# Patient Record
Sex: Male | Born: 1935
Health system: Southern US, Community
[De-identification: ages and names within clinical notes are randomized; demographics above are authoritative.]

## PROBLEM LIST (undated history)

## (undated) DIAGNOSIS — N3281 Overactive bladder: Secondary | ICD-10-CM

## (undated) DIAGNOSIS — J449 Chronic obstructive pulmonary disease, unspecified: Secondary | ICD-10-CM

## (undated) DIAGNOSIS — E039 Hypothyroidism, unspecified: Secondary | ICD-10-CM

## (undated) DIAGNOSIS — E2740 Unspecified adrenocortical insufficiency: Secondary | ICD-10-CM

## (undated) DIAGNOSIS — Z7901 Long term (current) use of anticoagulants: Secondary | ICD-10-CM

## (undated) DIAGNOSIS — D649 Anemia, unspecified: Secondary | ICD-10-CM

## (undated) DIAGNOSIS — N400 Enlarged prostate without lower urinary tract symptoms: Secondary | ICD-10-CM

## (undated) DIAGNOSIS — I2510 Atherosclerotic heart disease of native coronary artery without angina pectoris: Secondary | ICD-10-CM

## (undated) DIAGNOSIS — E559 Vitamin D deficiency, unspecified: Secondary | ICD-10-CM

## (undated) DIAGNOSIS — I480 Paroxysmal atrial fibrillation: Secondary | ICD-10-CM

## (undated) DIAGNOSIS — M1990 Unspecified osteoarthritis, unspecified site: Secondary | ICD-10-CM

## (undated) HISTORY — PX: PACEMAKER INSERTION: SHX728

---

## 2018-10-03 ENCOUNTER — Other Ambulatory Visit: Payer: Self-pay | Admitting: Internal Medicine

## 2018-10-03 DIAGNOSIS — M549 Dorsalgia, unspecified: Secondary | ICD-10-CM

## 2018-10-07 ENCOUNTER — Other Ambulatory Visit: Payer: Self-pay

## 2018-10-07 ENCOUNTER — Encounter (HOSPITAL_COMMUNITY): Payer: Self-pay

## 2018-10-07 ENCOUNTER — Emergency Department (HOSPITAL_COMMUNITY): Payer: 59

## 2018-10-07 ENCOUNTER — Inpatient Hospital Stay (HOSPITAL_COMMUNITY)
Admission: EM | Admit: 2018-10-07 | Discharge: 2018-10-11 | DRG: 308 | Disposition: A | Discharge status: Skilled Nursing Facility | Payer: 59 | Attending: Internal Medicine | Admitting: Internal Medicine

## 2018-10-07 DIAGNOSIS — N400 Enlarged prostate without lower urinary tract symptoms: Secondary | ICD-10-CM | POA: Diagnosis present

## 2018-10-07 DIAGNOSIS — N3281 Overactive bladder: Secondary | ICD-10-CM | POA: Diagnosis present

## 2018-10-07 DIAGNOSIS — E559 Vitamin D deficiency, unspecified: Secondary | ICD-10-CM | POA: Diagnosis present

## 2018-10-07 DIAGNOSIS — Z7901 Long term (current) use of anticoagulants: Secondary | ICD-10-CM

## 2018-10-07 DIAGNOSIS — I248 Other forms of acute ischemic heart disease: Secondary | ICD-10-CM | POA: Diagnosis present

## 2018-10-07 DIAGNOSIS — I480 Paroxysmal atrial fibrillation: Principal | ICD-10-CM | POA: Diagnosis present

## 2018-10-07 DIAGNOSIS — R52 Pain, unspecified: Secondary | ICD-10-CM

## 2018-10-07 DIAGNOSIS — L89151 Pressure ulcer of sacral region, stage 1: Secondary | ICD-10-CM | POA: Diagnosis present

## 2018-10-07 DIAGNOSIS — I2510 Atherosclerotic heart disease of native coronary artery without angina pectoris: Secondary | ICD-10-CM | POA: Diagnosis not present

## 2018-10-07 DIAGNOSIS — F419 Anxiety disorder, unspecified: Secondary | ICD-10-CM | POA: Diagnosis present

## 2018-10-07 DIAGNOSIS — J209 Acute bronchitis, unspecified: Secondary | ICD-10-CM | POA: Diagnosis present

## 2018-10-07 DIAGNOSIS — G8929 Other chronic pain: Secondary | ICD-10-CM | POA: Diagnosis present

## 2018-10-07 DIAGNOSIS — Z7951 Long term (current) use of inhaled steroids: Secondary | ICD-10-CM

## 2018-10-07 DIAGNOSIS — M545 Low back pain, unspecified: Secondary | ICD-10-CM

## 2018-10-07 DIAGNOSIS — R7989 Other specified abnormal findings of blood chemistry: Secondary | ICD-10-CM | POA: Diagnosis not present

## 2018-10-07 DIAGNOSIS — I4891 Unspecified atrial fibrillation: Secondary | ICD-10-CM | POA: Diagnosis not present

## 2018-10-07 DIAGNOSIS — E039 Hypothyroidism, unspecified: Secondary | ICD-10-CM | POA: Diagnosis present

## 2018-10-07 DIAGNOSIS — R778 Other specified abnormalities of plasma proteins: Secondary | ICD-10-CM | POA: Diagnosis present

## 2018-10-07 DIAGNOSIS — L8990 Pressure ulcer of unspecified site, unspecified stage: Secondary | ICD-10-CM

## 2018-10-07 DIAGNOSIS — E43 Unspecified severe protein-calorie malnutrition: Secondary | ICD-10-CM | POA: Diagnosis present

## 2018-10-07 DIAGNOSIS — Z7952 Long term (current) use of systemic steroids: Secondary | ICD-10-CM

## 2018-10-07 DIAGNOSIS — R64 Cachexia: Secondary | ICD-10-CM | POA: Diagnosis present

## 2018-10-07 DIAGNOSIS — Z950 Presence of cardiac pacemaker: Secondary | ICD-10-CM

## 2018-10-07 DIAGNOSIS — I951 Orthostatic hypotension: Secondary | ICD-10-CM | POA: Diagnosis present

## 2018-10-07 DIAGNOSIS — Z681 Body mass index (BMI) 19 or less, adult: Secondary | ICD-10-CM

## 2018-10-07 DIAGNOSIS — M549 Dorsalgia, unspecified: Secondary | ICD-10-CM | POA: Diagnosis present

## 2018-10-07 DIAGNOSIS — E785 Hyperlipidemia, unspecified: Secondary | ICD-10-CM | POA: Diagnosis present

## 2018-10-07 DIAGNOSIS — I5032 Chronic diastolic (congestive) heart failure: Secondary | ICD-10-CM | POA: Diagnosis present

## 2018-10-07 DIAGNOSIS — J9611 Chronic respiratory failure with hypoxia: Secondary | ICD-10-CM | POA: Diagnosis present

## 2018-10-07 DIAGNOSIS — M1990 Unspecified osteoarthritis, unspecified site: Secondary | ICD-10-CM | POA: Diagnosis present

## 2018-10-07 DIAGNOSIS — J440 Chronic obstructive pulmonary disease with acute lower respiratory infection: Secondary | ICD-10-CM | POA: Diagnosis present

## 2018-10-07 DIAGNOSIS — E2740 Unspecified adrenocortical insufficiency: Secondary | ICD-10-CM | POA: Diagnosis present

## 2018-10-07 DIAGNOSIS — Z79890 Hormone replacement therapy (postmenopausal): Secondary | ICD-10-CM

## 2018-10-07 DIAGNOSIS — J449 Chronic obstructive pulmonary disease, unspecified: Secondary | ICD-10-CM

## 2018-10-07 HISTORY — DX: Chronic obstructive pulmonary disease, unspecified: J44.9

## 2018-10-07 HISTORY — DX: Atherosclerotic heart disease of native coronary artery without angina pectoris: I25.10

## 2018-10-07 HISTORY — DX: Long term (current) use of anticoagulants: Z79.01

## 2018-10-07 HISTORY — DX: Benign prostatic hyperplasia without lower urinary tract symptoms: N40.0

## 2018-10-07 HISTORY — DX: Overactive bladder: N32.81

## 2018-10-07 HISTORY — DX: Unspecified osteoarthritis, unspecified site: M19.90

## 2018-10-07 HISTORY — DX: Anemia, unspecified: D64.9

## 2018-10-07 HISTORY — DX: Vitamin D deficiency, unspecified: E55.9

## 2018-10-07 HISTORY — DX: Paroxysmal atrial fibrillation: I48.0

## 2018-10-07 HISTORY — DX: Hypothyroidism, unspecified: E03.9

## 2018-10-07 HISTORY — DX: Unspecified adrenocortical insufficiency: E27.40

## 2018-10-07 LAB — COMPREHENSIVE METABOLIC PANEL
ALK PHOS: 91 U/L (ref 38–126)
ALT: 14 U/L (ref 0–44)
AST: 20 U/L (ref 15–41)
Albumin: 2.8 g/dL — ABNORMAL LOW (ref 3.5–5.0)
Anion gap: 13 (ref 5–15)
BUN: 31 mg/dL — ABNORMAL HIGH (ref 8–23)
CALCIUM: 8.6 mg/dL — AB (ref 8.9–10.3)
CO2: 21 mmol/L — ABNORMAL LOW (ref 22–32)
Chloride: 101 mmol/L (ref 98–111)
Creatinine, Ser: 0.77 mg/dL (ref 0.61–1.24)
GFR calc Af Amer: >60 mL/min (ref >60)
GFR calc non Af Amer: >60 mL/min (ref >60)
Glucose, Bld: 110 mg/dL — ABNORMAL HIGH (ref 70–99)
Potassium: 4.1 mmol/L (ref 3.5–5.1)
Sodium: 135 mmol/L (ref 135–145)
Total Bilirubin: 1.3 mg/dL — ABNORMAL HIGH (ref 0.3–1.2)
Total Protein: 5.2 g/dL — ABNORMAL LOW (ref 6.5–8.1)

## 2018-10-07 LAB — CBC WITH DIFFERENTIAL/PLATELET
Abs Immature Granulocytes: 0.17 10*3/uL — ABNORMAL HIGH (ref 0.00–0.07)
Basophils Absolute: 0 10*3/uL (ref 0.0–0.1)
Basophils Relative: 0 %
EOS ABS: 0 10*3/uL (ref 0.0–0.5)
Eosinophils Relative: 0 %
HEMATOCRIT: 36.5 % — AB (ref 39.0–52.0)
Hemoglobin: 12 g/dL — ABNORMAL LOW (ref 13.0–17.0)
Immature Granulocytes: 2 %
LYMPHS ABS: 0.5 10*3/uL — AB (ref 0.7–4.0)
Lymphocytes Relative: 4 %
MCH: 32.3 pg (ref 26.0–34.0)
MCHC: 32.9 g/dL (ref 30.0–36.0)
MCV: 98.1 fL (ref 80.0–100.0)
Monocytes Absolute: 0.7 10*3/uL (ref 0.1–1.0)
Monocytes Relative: 6 %
Neutro Abs: 10.1 10*3/uL — ABNORMAL HIGH (ref 1.7–7.7)
Neutrophils Relative %: 88 %
Platelets: 183 10*3/uL (ref 150–400)
RBC: 3.72 MIL/uL — ABNORMAL LOW (ref 4.22–5.81)
RDW: 13.9 % (ref 11.5–15.5)
WBC: 11.5 10*3/uL — ABNORMAL HIGH (ref 4.0–10.5)
nRBC: 0 % (ref 0.0–0.2)

## 2018-10-07 LAB — PROTIME-INR
INR: 3 — ABNORMAL HIGH (ref 0.8–1.2)
Prothrombin Time: 30.7 seconds — ABNORMAL HIGH (ref 11.4–15.2)

## 2018-10-07 LAB — TROPONIN I: Troponin I: 0.03 ng/mL (ref <0.03)

## 2018-10-07 LAB — BRAIN NATRIURETIC PEPTIDE: B Natriuretic Peptide: 356 pg/mL — ABNORMAL HIGH (ref 0.0–100.0)

## 2018-10-07 LAB — MAGNESIUM: Magnesium: 1.9 mg/dL (ref 1.7–2.4)

## 2018-10-07 LAB — TSH: TSH: 0.869 u[IU]/mL (ref 0.350–4.500)

## 2018-10-07 MED ORDER — LIDOCAINE 4 % EX PTCH: 1.0000 | MEDICATED_PATCH | Freq: Every day | CUTANEOUS | Status: DC

## 2018-10-07 MED ORDER — DILTIAZEM HCL 25 MG/5ML IV SOLN
15.0000 mg | Freq: Once | INTRAVENOUS | Status: AC
  Administered 2018-10-07: 15 mg via INTRAVENOUS
  Filled 2018-10-07: qty 5

## 2018-10-07 MED ORDER — WARFARIN 0.5 MG HALF TABLET
0.5000 mg | ORAL_TABLET | Freq: Once | ORAL | Status: AC
  Administered 2018-10-07: 0.5 mg via ORAL
  Filled 2018-10-07 (×2): qty 1

## 2018-10-07 MED ORDER — HYDROCODONE-ACETAMINOPHEN 5-325 MG PO TABS: 1.0000 | ORAL_TABLET | Freq: Four times a day (QID) | ORAL | Status: DC | PRN

## 2018-10-07 MED ORDER — BUDESONIDE 0.5 MG/2ML IN SUSP
0.5000 mg | Freq: Every day | RESPIRATORY_TRACT | Status: DC
  Administered 2018-10-08 (×2): 0.5 mg via RESPIRATORY_TRACT
  Filled 2018-10-07 (×2): qty 2

## 2018-10-07 MED ORDER — FLUDROCORTISONE ACETATE 0.1 MG PO TABS
0.1000 mg | ORAL_TABLET | Freq: Every day | ORAL | Status: DC
  Administered 2018-10-08 – 2018-10-11 (×4): 0.1 mg via ORAL
  Filled 2018-10-07 (×4): qty 1

## 2018-10-07 MED ORDER — ALPRAZOLAM 0.25 MG PO TABS
0.2500 mg | ORAL_TABLET | Freq: Every day | ORAL | Status: DC
  Administered 2018-10-07 – 2018-10-10 (×4): 0.25 mg via ORAL
  Filled 2018-10-07 (×4): qty 1

## 2018-10-07 MED ORDER — ALPRAZOLAM 0.25 MG PO TABS: 0.2500 mg | ORAL_TABLET | ORAL | Status: DC | PRN

## 2018-10-07 MED ORDER — ACETAMINOPHEN 325 MG PO TABS: 650.0000 mg | ORAL_TABLET | ORAL | Status: DC | PRN

## 2018-10-07 MED ORDER — LIDOCAINE 5 % EX PTCH
1.0000 | MEDICATED_PATCH | CUTANEOUS | Status: DC
  Administered 2018-10-08 – 2018-10-11 (×4): 1 via TRANSDERMAL
  Filled 2018-10-07 (×4): qty 1

## 2018-10-07 MED ORDER — IPRATROPIUM-ALBUTEROL 0.5-2.5 (3) MG/3ML IN SOLN: 3.0000 mL | Freq: Four times a day (QID) | RESPIRATORY_TRACT | Status: DC | PRN

## 2018-10-07 MED ORDER — IPRATROPIUM-ALBUTEROL 0.5-2.5 (3) MG/3ML IN SOLN
3.0000 mL | Freq: Two times a day (BID) | RESPIRATORY_TRACT | Status: DC
  Administered 2018-10-07: 3 mL via RESPIRATORY_TRACT
  Filled 2018-10-07 (×2): qty 3

## 2018-10-07 MED ORDER — WARFARIN - PHARMACIST DOSING INPATIENT
Freq: Every day | Status: DC
  Administered 2018-10-10

## 2018-10-07 MED ORDER — HYDROCODONE-ACETAMINOPHEN 5-325 MG PO TABS
1.0000 | ORAL_TABLET | Freq: Once | ORAL | Status: AC
  Administered 2018-10-07: 1 via ORAL
  Filled 2018-10-07: qty 1

## 2018-10-07 MED ORDER — IPRATROPIUM-ALBUTEROL 0.5-2.5 (3) MG/3ML IN SOLN
3.0000 mL | Freq: Two times a day (BID) | RESPIRATORY_TRACT | Status: DC
  Administered 2018-10-08: 3 mL via RESPIRATORY_TRACT
  Filled 2018-10-07: qty 3

## 2018-10-07 MED ORDER — ONDANSETRON HCL 4 MG/2ML IJ SOLN: 4.0000 mg | Freq: Four times a day (QID) | INTRAMUSCULAR | Status: DC | PRN

## 2018-10-07 MED ORDER — DILTIAZEM HCL-DEXTROSE 100-5 MG/100ML-% IV SOLN (PREMIX)
5.0000 mg/h | INTRAVENOUS | Status: DC
  Administered 2018-10-07: 5 mg/h via INTRAVENOUS
  Administered 2018-10-08 – 2018-10-10 (×3): 10 mg/h via INTRAVENOUS
  Filled 2018-10-07 (×6): qty 100

## 2018-10-07 MED ORDER — DILTIAZEM HCL 25 MG/5ML IV SOLN
10.0000 mg | Freq: Once | INTRAVENOUS | Status: AC
  Administered 2018-10-07: 10 mg via INTRAVENOUS
  Filled 2018-10-07: qty 5

## 2018-10-07 MED ORDER — FUROSEMIDE 10 MG/ML IJ SOLN
40.0000 mg | INTRAMUSCULAR | Status: AC
  Administered 2018-10-07: 40 mg via INTRAVENOUS
  Filled 2018-10-07: qty 4

## 2018-10-07 MED ORDER — ESCITALOPRAM OXALATE 10 MG PO TABS
10.0000 mg | ORAL_TABLET | Freq: Every day | ORAL | Status: DC
  Administered 2018-10-08 – 2018-10-11 (×4): 10 mg via ORAL
  Filled 2018-10-07 (×4): qty 1

## 2018-10-07 MED ORDER — BOOST / RESOURCE BREEZE PO LIQD CUSTOM
1.0000 | Freq: Two times a day (BID) | ORAL | Status: DC
  Administered 2018-10-09 – 2018-10-10 (×3): 1 via ORAL

## 2018-10-07 MED ORDER — PRAVASTATIN SODIUM 40 MG PO TABS
40.0000 mg | ORAL_TABLET | Freq: Every day | ORAL | Status: DC
  Administered 2018-10-08 – 2018-10-10 (×3): 40 mg via ORAL
  Filled 2018-10-07 (×3): qty 1

## 2018-10-07 MED ORDER — LEVOTHYROXINE SODIUM 88 MCG PO TABS
88.0000 ug | ORAL_TABLET | Freq: Every day | ORAL | Status: DC
  Administered 2018-10-08: 88 ug via ORAL
  Filled 2018-10-07: qty 1

## 2018-10-07 MED ORDER — PREDNISOLONE 5 MG PO TABS
5.0000 mg | ORAL_TABLET | Freq: Two times a day (BID) | ORAL | Status: DC
  Administered 2018-10-07: 5 mg via ORAL
  Filled 2018-10-07 (×3): qty 1

## 2018-10-07 MED ORDER — OXYBUTYNIN CHLORIDE 5 MG PO TABS
5.0000 mg | ORAL_TABLET | Freq: Every day | ORAL | Status: DC
  Administered 2018-10-07 – 2018-10-10 (×4): 5 mg via ORAL
  Filled 2018-10-07 (×4): qty 1

## 2018-10-07 NOTE — ED Triage Notes (Signed)
Pt brought in by [facility] from [facility] for chronic back pain that worsened over the last few days. EMS placed pt on 12-lead and noted pt HR to be 100-160 ion afib w/ rvr. Pt BP normotensive per EMS, pt denies any other complaints at this time.

## 2018-10-07 NOTE — ED Provider Notes (Signed)
[facility] EMERGENCY DEPARTMENT Provider Note   CSN: [id number] Arrival date & time: 10/07/18  1458    History   Chief Complaint Chief Complaint  Patient presents with  . Atrial Fibrillation  . Back Pain    HPI Vincent Mcneil is a 85 y.o. male.     85yo M w/ PMH including A fib on coumadin, COPD, CAD, pacemaker, adrenal insufficiency, hypothyroidism who p/w back pain and chest pain. Pt is a poor historian. Pt has h/o chronic low back pain that he states has been going on for months to years. He states his pain has been worse recently which he attributes to losing so much weight that there aren't enough pillows in his bed at his nursing facility to provide padding. Pain is in lower back and gets worse with certain movements. He denies any leg numbness but notes his ambulation is difficult 2/2 pain. He denies LE numbness or problems with urination. He also notes SOB that is chronic. He reports chest pain that he has had for "a while." He endorses some heart racing sensation that has been worse for the past 4 days.  The history is provided by the patient and the EMS personnel.  Atrial Fibrillation   Back Pain    Past Medical History:  Diagnosis Date  . Adrenal insufficiency (HCC)   . Anemia   . BPH without obstruction/lower urinary tract symptoms   . Chronic anticoagulation   . COPD (chronic obstructive pulmonary disease) (HCC)   . Coronary artery disease   . Hypothyroidism   . Osteoarthritis   . Overactive bladder   . Paroxysmal A-fib (HCC)   . Vitamin D deficiency     Patient Active Problem List   Diagnosis Date Noted  . Paroxysmal atrial fibrillation with RVR (HCC) 10/07/2018  . Hypothyroidism 10/07/2018  . Coronary artery disease 10/07/2018  . Steroid-dependent COPD (HCC) 10/07/2018  . Elevated troponin 10/07/2018  . Orthostatic hypotension 10/07/2018  . Chronic back pain 10/07/2018    Past Surgical History:  Procedure Laterality Date  .  PACEMAKER INSERTION          Home Medications    Prior to Admission medications   Medication Sig Start Date End Date Taking? Authorizing Provider  albuterol (PROVENTIL HFA;VENTOLIN HFA) 108 (90 Base) MCG/ACT inhaler Inhale 2 puffs into the lungs every 6 (six) hours as needed for wheezing or shortness of breath.   Yes [provider]  ALPRAZolam (XANAX) 0.25 MG tablet Take 0.25 mg by mouth at bedtime.   Yes [provider]  ALPRAZolam (XANAX) 0.25 MG tablet Take 0.25 mg by mouth as needed for anxiety. Started 09/29/18 for 14 days   Yes [provider]  budesonide (PULMICORT) 0.5 MG/2ML nebulizer solution Take 0.5 mg by nebulization daily.   Yes [provider]  Cholecalciferol (VITAMIN D3) 1.25 MG (50000 UT) CAPS Take 50,000 Units by mouth once a week. Thursday   Yes [provider]  cholecalciferol (VITAMIN D3) 10 MCG (400 UNIT) TABS tablet Take 400 Units by mouth every other day.   Yes [provider]  escitalopram (LEXAPRO) 10 MG tablet Take 10 mg by mouth daily.   Yes [provider]  feeding supplement (BOOST HIGH PROTEIN) LIQD Take 1 Container by mouth 2 (two) times daily.   Yes [provider]  ferrous sulfate 325 (65 FE) MG tablet Take 325 mg by mouth daily with breakfast.   Yes [provider]  fludrocortisone (FLORINEF) 0.1 MG tablet  Take 0.1 mg by mouth daily.   Yes [provider]  HYDROcodone-acetaminophen (NORCO/VICODIN) 5-325 MG tablet Take 1 tablet by mouth every 6 (six) hours as needed for moderate pain.   Yes [provider]  ipratropium-albuterol (DUONEB) 0.5-2.5 (3) MG/3ML SOLN Take 3 mLs by nebulization 2 (two) times daily.   Yes [provider]  ipratropium-albuterol (DUONEB) 0.5-2.5 (3) MG/3ML SOLN Take 3 mLs by nebulization every 6 (six) hours as needed.   Yes [provider]  Boris Lown Oil 1000 MG CAPS Take 1,000 mg by mouth daily.   Yes [provider]  levothyroxine (SYNTHROID, LEVOTHROID) 88 MCG tablet Take 88 mcg by mouth daily before breakfast.   Yes [provider]  Lidocaine 4 % PTCH Apply 1 packet topically daily. Apply to Back topically one time a day for pair remove in the H.S   Yes [provider]  OVER THE COUNTER MEDICATION Take 1 Container by mouth 2 (two) times daily. Magic Cup   Yes [provider]  oxybutynin (DITROPAN) 5 MG tablet Take 5 mg by mouth at bedtime.   Yes [provider]  OXYGEN Inhale 2 L into the lungs continuous.   Yes [provider]  potassium chloride (KLOR-CON) 20 MEQ packet Take 20 mEq by mouth 2 (two) times daily.   Yes [provider]  pravastatin (PRAVACHOL) 40 MG tablet Take 40 mg by mouth at bedtime.   Yes [provider]  prednisoLONE 5 MG TABS tablet Take 5 mg by mouth 2 (two) times daily.   Yes [provider]  tiotropium (SPIRIVA HANDIHALER) 18 MCG inhalation capsule Place 18 mcg into inhaler and inhale daily.   Yes [provider]  vitamin C (ASCORBIC ACID) 500 MG tablet Take 500 mg by mouth daily.   Yes [provider]  zinc sulfate 220 (50 Zn) MG capsule Take 220 mg by mouth daily.   Yes [provider]  warfarin (COUMADIN) 1 MG tablet Take 1 mg by mouth at bedtime.    [provider]    Family History History reviewed. No pertinent family history.  Social History Social History   Tobacco Use  . Smoking status: Not on file  Substance Use Topics  . Alcohol use: Not on file  . Drug use: Not on file     Allergies   Patient has no known allergies.   Review of Systems Review of Systems  Musculoskeletal: Positive for back pain.   All other systems reviewed and are negative except that which was mentioned in HPI   Physical Exam Updated Vital Signs BP (!) 137/104   Pulse 98   Temp (!) 97.4 F (36.3 C) (Oral)   Resp 14   SpO2 93%   Physical Exam Vitals signs and  nursing note reviewed.  Constitutional:      General: He is not in acute distress.    Appearance: He is well-developed.     Comments: Cachectic, chronically ill appearing but NAD  HENT:     Head: Normocephalic and atraumatic.     Nose: Nose normal.     Mouth/Throat:     Mouth: Mucous membranes are moist.  Eyes:     Conjunctiva/sclera: Conjunctivae normal.  Neck:     Musculoskeletal: Neck supple.  Cardiovascular:     Rate and Rhythm: Tachycardia present. Rhythm irregular.     Heart sounds: Normal heart sounds. No murmur.  Pulmonary:     Effort: Pulmonary effort is normal.  Comments: Rhonchi in bases b/l Abdominal:     General: Bowel sounds are normal. There is no distension.     Palpations: Abdomen is soft.     Tenderness: There is no abdominal tenderness.  Musculoskeletal:     Right lower leg: Edema present.     Left lower leg: Edema present.     Comments: 2+ pitting edema BLE  Skin:    General: Skin is warm and dry.  Neurological:     Mental Status: He is alert and oriented to person, place, and time.     Comments: Fluent speech  Psychiatric:        Judgment: Judgment normal.      ED Treatments / Results  Labs (all labs ordered are listed, but only abnormal results are displayed) Labs Reviewed  COMPREHENSIVE METABOLIC PANEL - Abnormal; Notable for the following components:      Result Value   CO2 21 (*)    Glucose, Bld 110 (*)    BUN 31 (*)    Calcium 8.6 (*)    Total Protein 5.2 (*)    Albumin 2.8 (*)    Total Bilirubin 1.3 (*)    All other components within normal limits  CBC WITH DIFFERENTIAL/PLATELET - Abnormal; Notable for the following components:   WBC 11.5 (*)    RBC 3.72 (*)    Hemoglobin 12.0 (*)    HCT 36.5 (*)    Neutro Abs 10.1 (*)    Lymphs Abs 0.5 (*)    Abs Immature Granulocytes 0.17 (*)    All other components within normal limits  BRAIN NATRIURETIC PEPTIDE - Abnormal; Notable for the following components:   B Natriuretic Peptide  356.0 (*)    All other components within normal limits  TROPONIN I - Abnormal; Notable for the following components:   Troponin I 0.03 (*)    All other components within normal limits  PROTIME-INR - Abnormal; Notable for the following components:   Prothrombin Time 30.7 (*)    INR 3.0 (*)    All other components within normal limits  TSH  MAGNESIUM  BASIC METABOLIC PANEL  CBC  PROTIME-INR  TROPONIN I    EKG EKG Interpretation  Date/Time:  Friday October 07 2018 15:00:12 EDT Ventricular Rate:  134 PR Interval:    QRS Duration: 133 QT Interval:  340 QTC Calculation: 510 R Axis:   -92 Text Interpretation:  Atrial fibrillation Nonspecific IVCD with LAD Abnormal lateral Q waves No previous ECGs available Confirmed by Frederick Peers [phone]) on 10/07/2018 3:04:19 PM   Radiology Dg Chest 2 View  Result Date: 10/07/2018 CLINICAL DATA:  Shortness of breath EXAM: CHEST - 2 VIEW COMPARISON:  None. FINDINGS: Lungs are hyperexpanded. There are small pleural effusions bilaterally. There is no frank edema or consolidation. Heart is upper normal in size with pulmonary vascularity normal. No adenopathy. Pacemaker leads are present in the regions of the right ventricle and small cardiac vein. Bones are osteoporotic. There is anterior wedging of a midthoracic vertebral body. IMPRESSION: Lungs hyperexpanded with small pleural effusions bilaterally. No edema or consolidation. Heart size within normal limits. Bones osteoporotic. Electronically Signed   By: Bretta Bang III M.D.   On: 10/07/2018 17:51    Procedures Procedures (including critical care time)  Medications Ordered in ED Medications  HYDROcodone-acetaminophen (NORCO/VICODIN) 5-325 MG per tablet 1 tablet (has no administration in time range)  pravastatin (PRAVACHOL) tablet 40 mg (has no administration in time range)  ALPRAZolam (XANAX) tablet 0.25 mg (  has no administration in time range)  ALPRAZolam (XANAX) tablet 0.25 mg (has no  administration in time range)  escitalopram (LEXAPRO) tablet 10 mg (has no administration in time range)  fludrocortisone (FLORINEF) tablet 0.1 mg (has no administration in time range)  levothyroxine (SYNTHROID, LEVOTHROID) tablet 88 mcg (has no administration in time range)  prednisoLONE tablet 5 mg (has no administration in time range)  oxybutynin (DITROPAN) tablet 5 mg (has no administration in time range)  budesonide (PULMICORT) nebulizer solution 0.5 mg (has no administration in time range)  ipratropium-albuterol (DUONEB) 0.5-2.5 (3) MG/3ML nebulizer solution 3 mL (has no administration in time range)  ipratropium-albuterol (DUONEB) 0.5-2.5 (3) MG/3ML nebulizer solution 3 mL (has no administration in time range)  Lidocaine 4 % PTCH 1 packet (has no administration in time range)  acetaminophen (TYLENOL) tablet 650 mg (has no administration in time range)  ondansetron (ZOFRAN) injection 4 mg (has no administration in time range)  feeding supplement (BOOST / RESOURCE BREEZE) liquid 1 Container (has no administration in time range)  diltiazem (CARDIZEM) 100 mg in dextrose 5% (1 mg/mL) infusion (has no administration in time range)  Warfarin - Pharmacist Dosing Inpatient (has no administration in time range)  HYDROcodone-acetaminophen (NORCO/VICODIN) 5-325 MG per tablet 1 tablet (1 tablet Oral Given 10/07/18 1638)  diltiazem (CARDIZEM) injection 10 mg (10 mg Intravenous Given 10/07/18 1630)  diltiazem (CARDIZEM) injection 15 mg (15 mg Intravenous Given 10/07/18 1712)  furosemide (LASIX) injection 40 mg (40 mg Intravenous Given 10/07/18 1850)     Initial Impression / Assessment and Plan / ED Course  I have reviewed the triage vital signs and the nursing notes.  Pertinent labs & imaging results that were available during my care of the patient were reviewed by me and considered in my medical decision making (see chart for details).        HR 110s-120s on arrival, A fib w/ RVR on EKG.  Normal O2 sat, no respiratory distress.  His back pain sounds chronic in nature, he states is the same pain he has been having for a while and he denies any neurologic symptoms to suggest cauda equina.  His chest pain sounds like a totally separate complaint that he also states has been going on for a while.  His nursing facility documentation reports chronic shortness of breath due to COPD and chronic low back pain.  Lab work shows normal creatinine, WBC 11.5, hemoglobin 12, BNP 356, troponin high 0.03, INR 3.  Chest x-ray shows bilateral pleural effusions.  After a few doses of diltiazem, the patient's heart rate dropped below 100 without requiring diltiazem drip.  However, I reviewed his MAR from nursing facility and he does not take any rate controlling meds. The pleural effusions on CXR suggest some degree of volume overload which may be due to longstanding A fib w/ RVR. Gave lasix and recommended admission given comorbidities and advanced age. Discussed w/ [facility], Dr. Antionette Char, who will admit for further care.  Final Clinical Impressions(s) / ED Diagnoses   Final diagnoses:  None    ED Discharge Orders    None       Agamjot Kilgallon, Ambrose Finland, MD 10/07/18 1958

## 2018-10-07 NOTE — Progress Notes (Addendum)
ANTICOAGULATION CONSULT NOTE - Initial Consult  Pharmacy Consult for warfarin Indication: atrial fibrillation  No Known Allergies  Patient Measurements:    Vital Signs: Temp: 97.4 F (36.3 C) (03/13 1502) Temp Source: Oral (03/13 1502) BP: 128/95 (03/13 1900) Pulse Rate: 103 (03/13 1900)  Labs: Recent Labs    10/07/18 1622  HGB 12.0*  HCT 36.5*  PLT 183  LABPROT 30.7*  INR 3.0*  CREATININE 0.77  TROPONINI 0.03*    CrCl cannot be calculated (Unknown ideal weight.).   Medical History: Past Medical History:  Diagnosis Date  . Adrenal insufficiency (HCC)   . Anemia   . BPH without obstruction/lower urinary tract symptoms   . Chronic anticoagulation   . COPD (chronic obstructive pulmonary disease) (HCC)   . Coronary artery disease   . Hypothyroidism   . Osteoarthritis   . Overactive bladder   . Paroxysmal A-fib (HCC)   . Vitamin D deficiency    Assessment: 31 yoM admitted 3/13 with back pain. Patient is on warfarin PTA for atrial fibrillation. INR 3.0 - on the upper end of therapeutic. Of note patient has had warfarin held the last 2 nights (last dose on 3/10 PM per nursing home [facility]) and last INR 3.2 on 3/12 reported from SNF. CBC stable on admit, Hgb 12, HCT 36.5, pltc 183.  Expect INR to continue to trend down over the next few days, so will give reduce dose of warfarin tonight to prevent patient from becoming subtherapeutic in upcoming days.   PTA dose: warfarin 1 mg PO daily  Goal of Therapy:  INR 2-3 Monitor platelets by anticoagulation protocol: Yes   Plan:  Give 0.5 mg warfarin x 1 tonight  Obtain daily INR Monitor for s/sx of bleeding  Thank you for allowing pharmacy to be a part of this patient's care.  Lenord Carbo, PharmD PGY1 Pharmacy Resident  10/07/2018,7:29 PM

## 2018-10-07 NOTE — ED Notes (Signed)
ED TO INPATIENT HANDOFF REPORT  ED Nurse Name and Phone #: Leafy Kindle [phone]  S Name/Age/Gender Vincent Mcneil 85 y.o. male Room/Bed: 031C/031C  Code Status   Code Status: Full Code  Home/SNF/Other Skilled nursing facility Patient oriented to: self, place and time Is this baseline? Yes   Triage Complete: Triage complete  Chief Complaint back pain  Triage Note Pt brought in by [facility] from [facility] for chronic back pain that worsened over the last few days. EMS placed pt on 12-lead and noted pt HR to be 100-160 ion afib w/ rvr. Pt BP normotensive per EMS, pt denies any other complaints at this time.    Allergies No Known Allergies  Level of Care/Admitting Diagnosis ED Disposition    ED Disposition Condition Comment   Admit  Hospital Area: [facility] [[id number]]  Level of Care: Progressive [102]  I expect the patient will be discharged within 24 hours: Yes  LOW acuity---Tx typically complete <24 hrs---ACUTE conditions typically can be evaluated <24 hours---LABS likely to return to acceptable levels <24 hours---IS near functional baseline---EXPECTED to return to current living arrangement---NOT newly hypoxic: Does not meet criteria for 5C-Observation unit  Diagnosis: Paroxysmal atrial fibrillation with RVR [facility]) [[id number]]  Admitting Physician: Briscoe Deutscher [[id number]]  Attending Physician: Briscoe Deutscher [[id number]]  PT Class (Do Not Modify): Observation [104]  PT Acc Code (Do Not Modify): Observation [10022]       B Medical/Surgery History Past Medical History:  Diagnosis Date  . Adrenal insufficiency (HCC)   . Anemia   . BPH without obstruction/lower urinary tract symptoms   . Chronic anticoagulation   . COPD (chronic obstructive pulmonary disease) (HCC)   . Coronary artery disease   . Hypothyroidism   . Osteoarthritis   . Overactive bladder   . Paroxysmal A-fib (HCC)   . Vitamin D deficiency    Past Surgical History:  Procedure  Laterality Date  . PACEMAKER INSERTION       A IV Location/Drains/Wounds Patient Lines/Drains/Airways Status   Active Line/Drains/Airways    Name:   Placement date:   Placement time:   Site:   Days:   Peripheral IV 10/07/18 Left Hand   10/07/18    -    Hand   less than 1          Intake/Output Last 24 hours No intake or output data in the 24 hours ending 10/07/18 2004  Labs/Imaging Results for orders placed or performed during the hospital encounter of 10/07/18 (from the past 48 hour(s))  Comprehensive metabolic panel     Status: Abnormal   Collection Time: 10/07/18  4:22 PM  Result Value Ref Range   Sodium 135 135 - 145 mmol/L   Potassium 4.1 3.5 - 5.1 mmol/L   Chloride 101 98 - 111 mmol/L   CO2 21 (L) 22 - 32 mmol/L   Glucose, Bld 110 (H) 70 - 99 mg/dL   BUN 31 (H) 8 - 23 mg/dL   Creatinine, Ser 4.45 0.61 - 1.24 mg/dL   Calcium 8.6 (L) 8.9 - 10.3 mg/dL   Total Protein 5.2 (L) 6.5 - 8.1 g/dL   Albumin 2.8 (L) 3.5 - 5.0 g/dL   AST 20 15 - 41 U/L   ALT 14 0 - 44 U/L   Alkaline Phosphatase 91 38 - 126 U/L   Total Bilirubin 1.3 (H) 0.3 - 1.2 mg/dL   GFR calc non Af Amer >60 >60 mL/min   GFR calc Af Amer >60 >  60 mL/min   Anion gap 13 5 - 15    Comment: Performed at [facility], [address], [place], [place] [address]  CBC with Differential     Status: Abnormal   Collection Time: 10/07/18  4:22 PM  Result Value Ref Range   WBC 11.5 (H) 4.0 - 10.5 K/uL   RBC 3.72 (L) 4.22 - 5.81 MIL/uL   Hemoglobin 12.0 (L) 13.0 - 17.0 g/dL   HCT 94.5 (L) 03.8 - 88.2 %   MCV 98.1 80.0 - 100.0 fL   MCH 32.3 26.0 - 34.0 pg   MCHC 32.9 30.0 - 36.0 g/dL   RDW 80.0 34.9 - 17.9 %   Platelets 183 150 - 400 K/uL   nRBC 0.0 0.0 - 0.2 %   Neutrophils Relative % 88 %   Neutro Abs 10.1 (H) 1.7 - 7.7 K/uL   Lymphocytes Relative 4 %   Lymphs Abs 0.5 (L) 0.7 - 4.0 K/uL   Monocytes Relative 6 %   Monocytes Absolute 0.7 0.1 - 1.0 K/uL   Eosinophils Relative 0 %   Eosinophils Absolute  0.0 0.0 - 0.5 K/uL   Basophils Relative 0 %   Basophils Absolute 0.0 0.0 - 0.1 K/uL   Immature Granulocytes 2 %   Abs Immature Granulocytes 0.17 (H) 0.00 - 0.07 K/uL    Comment: Performed at [facility], [address] [place], [place] [address]  Brain natriuretic peptide     Status: Abnormal   Collection Time: 10/07/18  4:22 PM  Result Value Ref Range   B Natriuretic Peptide 356.0 (H) 0.0 - 100.0 pg/mL    Comment: Performed at [facility], [address] [place], [place] [address]  Troponin I - Once     Status: Abnormal   Collection Time: 10/07/18  4:22 PM  Result Value Ref Range   Troponin I 0.03 (HH) <0.03 ng/mL    Comment: CRITICAL RESULT CALLED TO, READ BACK BY AND VERIFIED WITH: B Luiz Ochoa 1718 10/07/2018 D BRADLEY Performed at [facility], [address], [place], [place] [address]   Protime-INR     Status: Abnormal   Collection Time: 10/07/18  4:22 PM  Result Value Ref Range   Prothrombin Time 30.7 (H) 11.4 - 15.2 seconds   INR 3.0 (H) 0.8 - 1.2    Comment: (NOTE) INR goal varies based on device and disease states. Performed at [facility], [address] [place], [place] [address]    Dg Chest 2 View  Result Date: 10/07/2018 CLINICAL DATA:  Shortness of breath EXAM: CHEST - 2 VIEW COMPARISON:  None. FINDINGS: Lungs are hyperexpanded. There are small pleural effusions bilaterally. There is no frank edema or consolidation. Heart is upper normal in size with pulmonary vascularity normal. No adenopathy. Pacemaker leads are present in the regions of the right ventricle and small cardiac vein. Bones are osteoporotic. There is anterior wedging of a midthoracic vertebral body. IMPRESSION: Lungs hyperexpanded with small pleural effusions bilaterally. No edema or consolidation. Heart size within normal limits. Bones osteoporotic. Electronically Signed   By: Bretta Bang III M.D.   On: 10/07/2018 17:51    Pending Labs Unresulted Labs (From  admission, onward)    Start     Ordered   10/08/18 0500  Basic metabolic panel  Tomorrow morning,   R     10/07/18 1925   10/08/18 0500  CBC  Tomorrow morning,   R  10/07/18 1925   10/08/18 0500  Protime-INR  Tomorrow morning,   R     10/07/18 1925   10/08/18 0500  Protime-INR  Daily,   R     10/07/18 1946   10/08/18 0000  Troponin I - Once  Once,   R     10/07/18 1946   10/07/18 1920  TSH  Once,   R     10/07/18 1925   10/07/18 1920  Magnesium  Once,   R     10/07/18 1925          Vitals/Pain Today's Vitals   10/07/18 1845 10/07/18 1900 10/07/18 1915 10/07/18 1945  BP: (!) 124/97 (!) 128/95 (!) 134/97 (!) 137/104  Pulse: 93 (!) 103 (!) 103 98  Resp: Temp:      TempSrc:      SpO2: 94% 92% 92% 93%  PainSc:        Isolation Precautions No active isolations  Medications Medications  HYDROcodone-acetaminophen (NORCO/VICODIN) 5-325 MG per tablet 1 tablet (has no administration in time range)  pravastatin (PRAVACHOL) tablet 40 mg (has no administration in time range)  ALPRAZolam (XANAX) tablet 0.25 mg (has no administration in time range)  ALPRAZolam (XANAX) tablet 0.25 mg (has no administration in time range)  escitalopram (LEXAPRO) tablet 10 mg (has no administration in time range)  fludrocortisone (FLORINEF) tablet 0.1 mg (has no administration in time range)  levothyroxine (SYNTHROID, LEVOTHROID) tablet 88 mcg (has no administration in time range)  prednisoLONE tablet 5 mg (has no administration in time range)  oxybutynin (DITROPAN) tablet 5 mg (has no administration in time range)  budesonide (PULMICORT) nebulizer solution 0.5 mg (has no administration in time range)  ipratropium-albuterol (DUONEB) 0.5-2.5 (3) MG/3ML nebulizer solution 3 mL (has no administration in time range)  ipratropium-albuterol (DUONEB) 0.5-2.5 (3) MG/3ML nebulizer solution 3 mL (has no administration in time range)  Lidocaine 4 % PTCH 1 packet (has no administration in time  range)  acetaminophen (TYLENOL) tablet 650 mg (has no administration in time range)  ondansetron (ZOFRAN) injection 4 mg (has no administration in time range)  feeding supplement (BOOST / RESOURCE BREEZE) liquid 1 Container (has no administration in time range)  diltiazem (CARDIZEM) 100 mg in dextrose 5% (1 mg/mL) infusion (has no administration in time range)  Warfarin - Pharmacist Dosing Inpatient (has no administration in time range)  HYDROcodone-acetaminophen (NORCO/VICODIN) 5-325 MG per tablet 1 tablet (1 tablet Oral Given 10/07/18 1638)  diltiazem (CARDIZEM) injection 10 mg (10 mg Intravenous Given 10/07/18 1630)  diltiazem (CARDIZEM) injection 15 mg (15 mg Intravenous Given 10/07/18 1712)  furosemide (LASIX) injection 40 mg (40 mg Intravenous Given 10/07/18 1850)    Mobility walks with person assist High fall risk   Focused Assessments Cardiac Assessment Handoff:  Cardiac Rhythm: Atrial fibrillation Lab Results  Component Value Date   TROPONINI 0.03 (HH) 10/07/2018   No results found for: DDIMER Does the Patient currently have chest pain? No     R Recommendations: See Admitting Provider Note  Report given to:   Additional Notes:

## 2018-10-07 NOTE — H&P (Signed)
History and Physical    Vincent Mcneil ZOX:[id number] DOB: 01/01/1933 DOA: 10/07/2018  PCP: Linus Salmons, DO   Patient coming from: SNF   Chief Complaint: Worsening in acute back pain, atrial fib with rvr   HPI: Vincent Mcneil is a 85 y.o. male with medical history significant for steroid-dependent COPD with chronic hypoxic respiratory failure, paroxysmal atrial fibrillation on warfarin, sick sinus syndrome with pacer, orthostatic hypotension, hypothyroidism, and anxiety, presenting to the emergency department for evaluation of increase in his chronic back pain, as well as atrial fibrillation with RVR.  Patient has history of atrial fibrillation, previously on amiodarone, and with documentation of very low A. fib burden as of October 2019.  He is noted to no longer be on amiodarone, but patient uncertain about why it was discontinued.  He denies any change in his chronic dyspnea and cough, reports increased pain in his lower back, mainly when he is sitting, improved if he sits on a pillow.  He has been evaluated for this acute on chronic back pain at his SNF where he was found to have no acute lumbar fracture, but suspected chronic T7 compression fracture.  There is no acute leg weakness or saddle paresthesias, and no fevers or chills.  He was being evaluated for his acute on chronic back pain today when he was noted to have heart rate as high as 160 with atrial fibrillation noted.  He was sent to the ED for further evaluation of this.  ED Course: Upon arrival to the ED, patient is found to be afebrile, saturating low 90s on his usual supplemental oxygen, tachycardic to 130s, and with stable blood pressure.  EKG features atrial fibrillation with rate 134 and nonspecific IVCD.  Chest x-ray is notable for hyperexpansion with small bilateral pleural effusions.  Chemistry panel features elevated BUN to creatinine ratio and albumin of 2.8.  CBC features a mild leukocytosis and slight normocytic anemia.  INR is  therapeutic at 3.0, troponin is slightly elevated at 0.03, and BNP is 356.  Patient was given 2 IV boluses of diltiazem with improvement in his heart rate.  He was given 40 mg IV Lasix as well in the emergency department, remains hemodynamically stable at this time, and will be observed for ongoing evaluation and management.  Review of Systems:  All other systems reviewed and apart from HPI, are negative.  Past Medical History:  Diagnosis Date  . Adrenal insufficiency (HCC)   . Anemia   . BPH without obstruction/lower urinary tract symptoms   . Chronic anticoagulation   . COPD (chronic obstructive pulmonary disease) (HCC)   . Coronary artery disease   . Hypothyroidism   . Osteoarthritis   . Overactive bladder   . Paroxysmal A-fib (HCC)   . Vitamin D deficiency     Past Surgical History:  Procedure Laterality Date  . PACEMAKER INSERTION       has no history on file for tobacco, alcohol, and drug.  No Known Allergies  History reviewed. No pertinent family history.   Prior to Admission medications   Medication Sig Start Date End Date Taking? Authorizing Provider  albuterol (PROVENTIL HFA;VENTOLIN HFA) 108 (90 Base) MCG/ACT inhaler Inhale 2 puffs into the lungs every 6 (six) hours as needed for wheezing or shortness of breath.   Yes [provider]  ALPRAZolam (XANAX) 0.25 MG tablet Take 0.25 mg by mouth at bedtime.   Yes [provider]  ALPRAZolam (XANAX) 0.25 MG tablet Take 0.25 mg by mouth as needed  for anxiety. Started 09/29/18 for 14 days   Yes [provider]  budesonide (PULMICORT) 0.5 MG/2ML nebulizer solution Take 0.5 mg by nebulization daily.   Yes [provider]  Cholecalciferol (VITAMIN D3) 1.25 MG (50000 UT) CAPS Take 50,000 Units by mouth once a week. Thursday   Yes [provider]  cholecalciferol (VITAMIN D3) 10 MCG (400 UNIT) TABS tablet Take 400 Units by mouth every other day.   Yes [provider]   escitalopram (LEXAPRO) 10 MG tablet Take 10 mg by mouth daily.   Yes [provider]  feeding supplement (BOOST HIGH PROTEIN) LIQD Take 1 Container by mouth 2 (two) times daily.   Yes [provider]  ferrous sulfate 325 (65 FE) MG tablet Take 325 mg by mouth daily with breakfast.   Yes [provider]  fludrocortisone (FLORINEF) 0.1 MG tablet Take 0.1 mg by mouth daily.   Yes [provider]  HYDROcodone-acetaminophen (NORCO/VICODIN) 5-325 MG tablet Take 1 tablet by mouth every 6 (six) hours as needed for moderate pain.   Yes [provider]  ipratropium-albuterol (DUONEB) 0.5-2.5 (3) MG/3ML SOLN Take 3 mLs by nebulization 2 (two) times daily.   Yes [provider]  ipratropium-albuterol (DUONEB) 0.5-2.5 (3) MG/3ML SOLN Take 3 mLs by nebulization every 6 (six) hours as needed.   Yes [provider]  Boris Lown Oil 1000 MG CAPS Take 1,000 mg by mouth daily.   Yes [provider]  levothyroxine (SYNTHROID, LEVOTHROID) 88 MCG tablet Take 88 mcg by mouth daily before breakfast.   Yes [provider]  Lidocaine 4 % PTCH Apply 1 packet topically daily. Apply to Back topically one time a day for pair remove in the H.S   Yes [provider]  OVER THE COUNTER MEDICATION Take 1 Container by mouth 2 (two) times daily. Magic Cup   Yes [provider]  oxybutynin (DITROPAN) 5 MG tablet Take 5 mg by mouth at bedtime.   Yes [provider]  OXYGEN Inhale 2 L into the lungs continuous.   Yes [provider]  potassium chloride (KLOR-CON) 20 MEQ packet Take 20 mEq by mouth 2 (two) times daily.   Yes [provider]  pravastatin (PRAVACHOL) 40 MG tablet Take 40 mg by mouth at bedtime.   Yes [provider]  prednisoLONE 5 MG TABS tablet Take 5 mg by mouth 2 (two) times daily.   Yes [provider]  tiotropium (SPIRIVA HANDIHALER) 18 MCG inhalation capsule Place 18 mcg into  inhaler and inhale daily.   Yes [provider]  vitamin C (ASCORBIC ACID) 500 MG tablet Take 500 mg by mouth daily.   Yes [provider]  zinc sulfate 220 (50 Zn) MG capsule Take 220 mg by mouth daily.   Yes [provider]  warfarin (COUMADIN) 1 MG tablet Take 1 mg by mouth at bedtime.    [provider]    Physical Exam: Vitals:   10/07/18 1815 10/07/18 1830 10/07/18 1845 10/07/18 1900  BP: 115/90 (!) 122/95 (!) 124/97 (!) 128/95  Pulse: 90 100 93 (!) 103  Resp: Temp:      TempSrc:      SpO2: 92% 93% 94% 92%    Constitutional: NAD, calm, frail  Eyes: PERTLA, lids and conjunctivae normal ENMT: Mucous membranes are moist. Posterior pharynx clear of any exudate or lesions.   Neck: normal, supple, no masses, no thyromegaly Respiratory: Diminished bilaterally, no wheezing, no  crackles. Normal respiratory effort.   Cardiovascular: Rate ~120 and irregular. Pretibial pitting edema bilaterally. Abdomen: No distension, no tenderness, soft. Bowel sounds normal.  Musculoskeletal: no clubbing / cyanosis. No joint deformity upper and lower extremities.   Skin: no significant rashes, lesions, ulcers. Warm, dry, well-perfused. Neurologic: No gross facial asymmetry. Sensation intact. Moving all extremities.  Psychiatric: Alert and oriented to person, place, and situation. Pleasant, cooperative.    Labs on Admission: I have personally reviewed following labs and imaging studies  CBC: Recent Labs  Lab 10/07/18 1622  WBC 11.5*  NEUTROABS 10.1*  HGB 12.0*  HCT 36.5*  MCV 98.1  PLT 183   Basic Metabolic Panel: Recent Labs  Lab 10/07/18 1622  NA 135  K 4.1  CL 101  CO2 21*  GLUCOSE 110*  BUN 31*  CREATININE 0.77  CALCIUM 8.6*   GFR: CrCl cannot be calculated (Unknown ideal weight.). Liver Function Tests: Recent Labs  Lab 10/07/18 1622  AST 20  ALT 14  ALKPHOS 91  BILITOT 1.3*  PROT 5.2*  ALBUMIN 2.8*   No results for  input(s): LIPASE, AMYLASE in the last 168 hours. No results for input(s): AMMONIA in the last 168 hours. Coagulation Profile: Recent Labs  Lab 10/07/18 1622  INR 3.0*   Cardiac Enzymes: Recent Labs  Lab 10/07/18 1622  TROPONINI 0.03*   BNP (last 3 results) No results for input(s): PROBNP in the last 8760 hours. HbA1C: No results for input(s): HGBA1C in the last 72 hours. CBG: No results for input(s): GLUCAP in the last 168 hours. Lipid Profile: No results for input(s): CHOL, HDL, LDLCALC, TRIG, CHOLHDL, LDLDIRECT in the last 72 hours. Thyroid Function Tests: No results for input(s): TSH, T4TOTAL, FREET4, T3FREE, THYROIDAB in the last 72 hours. Anemia Panel: No results for input(s): VITAMINB12, FOLATE, FERRITIN, TIBC, IRON, RETICCTPCT in the last 72 hours. Urine analysis: No results found for: COLORURINE, APPEARANCEUR, LABSPEC, PHURINE, GLUCOSEU, HGBUR, BILIRUBINUR, KETONESUR, PROTEINUR, UROBILINOGEN, NITRITE, LEUKOCYTESUR Sepsis Labs: @LABRCNTIP (procalcitonin:4,lacticidven:4) )No results found for this or any previous visit (from the past 240 hour(s)).   Radiological Exams on Admission: Dg Chest 2 View  Result Date: 10/07/2018 CLINICAL DATA:  Shortness of breath EXAM: CHEST - 2 VIEW COMPARISON:  None. FINDINGS: Lungs are hyperexpanded. There are small pleural effusions bilaterally. There is no frank edema or consolidation. Heart is upper normal in size with pulmonary vascularity normal. No adenopathy. Pacemaker leads are present in the regions of the right ventricle and small cardiac vein. Bones are osteoporotic. There is anterior wedging of a midthoracic vertebral body. IMPRESSION: Lungs hyperexpanded with small pleural effusions bilaterally. No edema or consolidation. Heart size within normal limits. Bones osteoporotic. Electronically Signed   By: Bretta Bang III M.D.   On: 10/07/2018 17:51    EKG: Independently reviewed. Atrial fibrillation with RVR, rate 134,  non-specific IVCD.   Assessment/Plan   1. Paroxysmal atrial fibrillation with RVR  - Presents after being found to have rapid HR with rates as high as 160  - He had been on amiodarone as of October 2019 with documentation of very low a fib burden at that time; he is no longer on amiodarone  - He is asymptomatic with this and maintaining normal BP  - HR improved transiently after diltiazem 10 mg IV, and then 15 mg IV, but now back into 120-130 range  - Check TSH and magnesium levels, start diltiazem infusion with titration as needed, continue cardiac monitoring, continue warfarin   2. Chronic diastolic CHF  -  Bilateral LE edema noted, reported to be chronic  - No JVD or rales, small pleural effusions on CXR without congestion or edema  - Given 40 mg IV Lasix in ED  - Follow daily wt and I/O's    3. CAD; elevated troponin  - Denies chest pain, found to have slightly elevated troponin in setting of AF with RVR  - Continue cardiac monitoring, continue statin and anticoagulation, trend troponin    4. Back pain  - Chronic low back pain, worse recently  - This is being addressed at his SNF where he was found to have no acute lumbar fx and likely old T7 compression fx on imaging  - He denies any new LE weakness or saddle paresthesia, no fever or chills   5. Steroid-dependent COPD; chronic hypoxic respiratory failure  - Stable  - Continue supplemental O2, steroids, ICS, nebs   6. Hypothyroidism  - Check TSH given rapid HR  - Continue Synthroid    7. Anxiety  - Stable, continue Lexapro and Xanax    8. Orthostatic hypotension  - Continue florinef   9. Protein-calorie malnutrition  - Continue nutritional supplements     DVT prophylaxis: warfarin  Code Status: Full; confirmed with patient   Family Communication: Discussed with patient  Consults called: none  Admission status: Observation     Briscoe Deutscher, MD [facility] Pager [phone]  If 7PM-7AM, please contact  night-coverage [email or web address] Password [facility]  10/07/2018, 7:25 PM

## 2018-10-08 ENCOUNTER — Inpatient Hospital Stay (HOSPITAL_COMMUNITY): Payer: 59

## 2018-10-08 DIAGNOSIS — E43 Unspecified severe protein-calorie malnutrition: Secondary | ICD-10-CM | POA: Diagnosis present

## 2018-10-08 DIAGNOSIS — F419 Anxiety disorder, unspecified: Secondary | ICD-10-CM | POA: Diagnosis present

## 2018-10-08 DIAGNOSIS — G8929 Other chronic pain: Secondary | ICD-10-CM | POA: Diagnosis present

## 2018-10-08 DIAGNOSIS — R7989 Other specified abnormal findings of blood chemistry: Secondary | ICD-10-CM | POA: Diagnosis not present

## 2018-10-08 DIAGNOSIS — Z7901 Long term (current) use of anticoagulants: Secondary | ICD-10-CM | POA: Diagnosis not present

## 2018-10-08 DIAGNOSIS — E032 Hypothyroidism due to medicaments and other exogenous substances: Secondary | ICD-10-CM

## 2018-10-08 DIAGNOSIS — R64 Cachexia: Secondary | ICD-10-CM | POA: Diagnosis present

## 2018-10-08 DIAGNOSIS — N3281 Overactive bladder: Secondary | ICD-10-CM | POA: Diagnosis present

## 2018-10-08 DIAGNOSIS — I2510 Atherosclerotic heart disease of native coronary artery without angina pectoris: Secondary | ICD-10-CM | POA: Diagnosis present

## 2018-10-08 DIAGNOSIS — I5032 Chronic diastolic (congestive) heart failure: Secondary | ICD-10-CM | POA: Diagnosis present

## 2018-10-08 DIAGNOSIS — I951 Orthostatic hypotension: Secondary | ICD-10-CM | POA: Diagnosis present

## 2018-10-08 DIAGNOSIS — L89002 Pressure ulcer of unspecified elbow, stage 2: Secondary | ICD-10-CM

## 2018-10-08 DIAGNOSIS — M1990 Unspecified osteoarthritis, unspecified site: Secondary | ICD-10-CM | POA: Diagnosis present

## 2018-10-08 DIAGNOSIS — E559 Vitamin D deficiency, unspecified: Secondary | ICD-10-CM | POA: Diagnosis present

## 2018-10-08 DIAGNOSIS — I480 Paroxysmal atrial fibrillation: Secondary | ICD-10-CM | POA: Diagnosis present

## 2018-10-08 DIAGNOSIS — Z7952 Long term (current) use of systemic steroids: Secondary | ICD-10-CM | POA: Diagnosis not present

## 2018-10-08 DIAGNOSIS — N400 Enlarged prostate without lower urinary tract symptoms: Secondary | ICD-10-CM | POA: Diagnosis present

## 2018-10-08 DIAGNOSIS — J440 Chronic obstructive pulmonary disease with acute lower respiratory infection: Secondary | ICD-10-CM | POA: Diagnosis present

## 2018-10-08 DIAGNOSIS — Z79890 Hormone replacement therapy (postmenopausal): Secondary | ICD-10-CM | POA: Diagnosis not present

## 2018-10-08 DIAGNOSIS — Z681 Body mass index (BMI) 19 or less, adult: Secondary | ICD-10-CM | POA: Diagnosis not present

## 2018-10-08 DIAGNOSIS — E2740 Unspecified adrenocortical insufficiency: Secondary | ICD-10-CM | POA: Diagnosis present

## 2018-10-08 DIAGNOSIS — J9611 Chronic respiratory failure with hypoxia: Secondary | ICD-10-CM | POA: Diagnosis present

## 2018-10-08 DIAGNOSIS — E039 Hypothyroidism, unspecified: Secondary | ICD-10-CM | POA: Diagnosis present

## 2018-10-08 DIAGNOSIS — M549 Dorsalgia, unspecified: Secondary | ICD-10-CM

## 2018-10-08 DIAGNOSIS — Z7951 Long term (current) use of inhaled steroids: Secondary | ICD-10-CM | POA: Diagnosis not present

## 2018-10-08 DIAGNOSIS — Z950 Presence of cardiac pacemaker: Secondary | ICD-10-CM | POA: Diagnosis not present

## 2018-10-08 DIAGNOSIS — L8990 Pressure ulcer of unspecified site, unspecified stage: Secondary | ICD-10-CM

## 2018-10-08 DIAGNOSIS — I248 Other forms of acute ischemic heart disease: Secondary | ICD-10-CM | POA: Diagnosis present

## 2018-10-08 DIAGNOSIS — I4891 Unspecified atrial fibrillation: Secondary | ICD-10-CM | POA: Diagnosis present

## 2018-10-08 LAB — CBC
HCT: 38.3 % — ABNORMAL LOW (ref 39.0–52.0)
Hemoglobin: 12.9 g/dL — ABNORMAL LOW (ref 13.0–17.0)
MCH: 31.5 pg (ref 26.0–34.0)
MCHC: 33.7 g/dL (ref 30.0–36.0)
MCV: 93.6 fL (ref 80.0–100.0)
PLATELETS: 196 10*3/uL (ref 150–400)
RBC: 4.09 MIL/uL — ABNORMAL LOW (ref 4.22–5.81)
RDW: 13.8 % (ref 11.5–15.5)
WBC: 10.8 10*3/uL — ABNORMAL HIGH (ref 4.0–10.5)
nRBC: 0 % (ref 0.0–0.2)

## 2018-10-08 LAB — BASIC METABOLIC PANEL
Anion gap: 13 (ref 5–15)
BUN: 25 mg/dL — AB (ref 8–23)
CO2: 25 mmol/L (ref 22–32)
Calcium: 8.5 mg/dL — ABNORMAL LOW (ref 8.9–10.3)
Chloride: 98 mmol/L (ref 98–111)
Creatinine, Ser: 0.73 mg/dL (ref 0.61–1.24)
GFR calc Af Amer: >60 mL/min (ref >60)
GFR calc non Af Amer: >60 mL/min (ref >60)
Glucose, Bld: 104 mg/dL — ABNORMAL HIGH (ref 70–99)
Potassium: 3.5 mmol/L (ref 3.5–5.1)
Sodium: 136 mmol/L (ref 135–145)

## 2018-10-08 LAB — TROPONIN I: Troponin I: 0.03 ng/mL (ref <0.03)

## 2018-10-08 LAB — PROTIME-INR
INR: 3.1 — ABNORMAL HIGH (ref 0.8–1.2)
Prothrombin Time: 31.3 seconds — ABNORMAL HIGH (ref 11.4–15.2)

## 2018-10-08 MED ORDER — SODIUM CHLORIDE 0.9 % IV SOLN: 1.0000 g | INTRAVENOUS | Status: DC

## 2018-10-08 MED ORDER — POTASSIUM CHLORIDE CRYS ER 20 MEQ PO TBCR
40.0000 meq | EXTENDED_RELEASE_TABLET | Freq: Two times a day (BID) | ORAL | Status: DC
  Administered 2018-10-08: 40 meq via ORAL
  Filled 2018-10-08 (×3): qty 2

## 2018-10-08 MED ORDER — METOPROLOL TARTRATE 25 MG PO TABS
25.0000 mg | ORAL_TABLET | Freq: Two times a day (BID) | ORAL | Status: DC
  Administered 2018-10-08 – 2018-10-11 (×6): 25 mg via ORAL
  Filled 2018-10-08 (×6): qty 1

## 2018-10-08 MED ORDER — LEVOTHYROXINE SODIUM 75 MCG PO TABS
75.0000 ug | ORAL_TABLET | Freq: Every day | ORAL | Status: DC
  Administered 2018-10-09 – 2018-10-11 (×3): 75 ug via ORAL
  Filled 2018-10-08 (×3): qty 1

## 2018-10-08 MED ORDER — DEXTROSE 5 % IV SOLN
250.0000 mg | INTRAVENOUS | Status: DC
  Administered 2018-10-08: 250 mg via INTRAVENOUS
  Filled 2018-10-08: qty 250

## 2018-10-08 MED ORDER — IPRATROPIUM-ALBUTEROL 0.5-2.5 (3) MG/3ML IN SOLN
3.0000 mL | Freq: Four times a day (QID) | RESPIRATORY_TRACT | Status: DC
  Administered 2018-10-08 – 2018-10-09 (×4): 3 mL via RESPIRATORY_TRACT
  Filled 2018-10-08 (×4): qty 3

## 2018-10-08 MED ORDER — SODIUM CHLORIDE 0.9 % IV SOLN
1.0000 g | INTRAVENOUS | Status: DC
  Administered 2018-10-08: 1 g via INTRAVENOUS
  Filled 2018-10-08: qty 10

## 2018-10-08 MED ORDER — METHYLPREDNISOLONE SODIUM SUCC 40 MG IJ SOLR
40.0000 mg | Freq: Four times a day (QID) | INTRAMUSCULAR | Status: DC
  Administered 2018-10-08 – 2018-10-10 (×9): 40 mg via INTRAVENOUS
  Filled 2018-10-08 (×9): qty 1

## 2018-10-08 NOTE — Plan of Care (Signed)
  Problem: Education: Goal: Knowledge of General Education information will improve Description: Including pain rating scale, medication(s)/side effects and non-pharmacologic comfort measures Outcome: Progressing   Problem: Clinical Measurements: Goal: Respiratory complications will improve Outcome: Progressing Goal: Cardiovascular complication will be avoided Outcome: Progressing   

## 2018-10-08 NOTE — Progress Notes (Signed)
PROGRESS NOTE    Vincent Mcneil  HAF:[id number] DOB: 12/26/1932 DOA: 10/07/2018 PCP: Linus Salmons, DO   Brief Narrative: 85 year old male with a significant history of steroid-dependent COPD, chronic hypoxic respiratory failure, paroxysmal atrial fibrillation on chronic anticoagulation with Coumadin, sick sinus syndrome s/p pacemaker placement, orthostatic hypotension, hypothyroidism presented to the emergency department with complaints of chronic back pain.  X-ray of the thoracic spine concerning about T7 fracture.  Patient was found to be in atrial fibrillation with rapid ventricular rate of 150s.  Patient was given 2 doses of IV Cardizem, started the patient on Cardizem drip.  Patient was on amiodarone in the past, felt very low A. fib burden as of October 2019, taken off of amiodarone.  Patient is more complaining of pain in the coccyx area, denies having any pain in the thoracic area.  Patient is a poor historian.  Work-up in the emergency department with a chest x-ray showed hyperexpansion with the small bilateral pleural effusions.  Patient was also found to have elevated BUN, creatinine ratio.  Assessment & Plan:   Principal Problem:   Paroxysmal atrial fibrillation with RVR (HCC) Active Problems:   Hypothyroidism   Coronary artery disease   Steroid-dependent COPD (HCC)   Elevated troponin   Orthostatic hypotension   Chronic back pain   Pressure injury of skin   Atrial fibrillation with RVR (HCC)   ## Paroxysmal atrial fibrillation with RVR  - Presents after being found to have rapid HR with rates as high as 160  - He had been on amiodarone as of October 2019 with documentation of very low a fib burden at that time; he is no longer on amiodarone  - He is asymptomatic with this and maintaining normal BP  - HR improved transiently after diltiazem 10 mg IV, and then 15 mg IV, but now back into 120-130 range .  Currently on Cardizem drip -  TSH-0.8, electrolytes-potassium 3.5, maintain  greater than 4 - continue cardiac monitoring, continue warfarin  -If patient's heart rate does not improve, consider consulting cardiology  ## Chronic diastolic CHF  - Bilateral LE edema noted, reported to be chronic  - No JVD or rales, small pleural effusions on CXR without congestion or edema  - Given 40 mg IV Lasix in ED  - Follow daily wt and I/O's    ## CAD; elevated troponin  - Denies chest pain, found to have slightly elevated troponin in setting of AF with RVR  - Continue cardiac monitoring, continue statin and anticoagulation, trend troponin    ##Back pain  - Chronic low back pain, worse recently  - This is being addressed at his SNF where he was found to have no acute lumbar fx and likely old T7 compression fx on imaging  - He denies any new LE weakness or saddle paresthesia, no fever or chills  -Patient has minimal pain on palpation  ##Pain in the coccyx area -Get x-ray of the sacral bone  ## Steroid-dependent COPD; chronic hypoxic respiratory failure  - Stable  - Continue supplemental O2, steroids, ICS, nebs   -discontinue antibiotics as patient does not have any fever, productive cough, elevated white blood cell count  ## Hypothyroidism  - Check TSH given rapid HR -0.8 - Continue Synthroid   at 75 mcg daily  ## Anxiety  - Stable, continue Lexapro and Xanax    ##Orthostatic hypotension  - Continue florinef   ##Protein-calorie malnutrition  - Continue nutritional supplements     DVT prophylaxis: Coumadin Code  Status: (Full-patient refused to answer this question.  Patient stated that all his 3 children make decision Family Communication: No family member present at bedside  disposition Plan: (SNF    Subjective: Complaining of pain in the coccyx area  Objective: Vitals:   10/08/18 1001 10/08/18 1031 10/08/18 1115 10/08/18 1424  BP: 115/85 (!) 114/101 109/81   Pulse:   88   Resp: 19 (!) 21    Temp:   97.7 F (36.5 C)   TempSrc:   Oral    SpO2:   96% 96%  Weight:        Intake/Output Summary (Last 24 hours) at 10/08/2018 1447 Last data filed at 10/08/2018 0300 Gross per 24 hour  Intake -  Output 1500 ml  Net -1500 ml   Filed Weights   10/08/18 0429  Weight: 53.2 kg    Examination:  General exam: Appears calm and comfortable  Respiratory system: Clear to auscultation. Respiratory effort normal. Cardiovascular system: S1 & S2 heard, RRR. No JVD, murmurs, rubs, gallops or clicks. No pedal edema. Gastrointestinal system: Abdomen is nondistended, soft and nontender. No organomegaly or masses felt. Normal bowel sounds heard. Central nervous system: Alert and oriented. No focal neurological deficits. Extremities: Symmetric 5 x 5 power. Skin: No rashes, lesions or ulcers Psychiatry: Judgement and insight appear normal. Mood & affect appropriate.     Data Reviewed: I have personally reviewed following labs and imaging studies  CBC: Recent Labs  Lab 10/07/18 1622 10/08/18 0040  WBC 11.5* 10.8*  NEUTROABS 10.1*  --   HGB 12.0* 12.9*  HCT 36.5* 38.3*  MCV 98.1 93.6  PLT 183 196   Basic Metabolic Panel: Recent Labs  Lab 10/07/18 1622 10/07/18 1957 10/08/18 0040  NA 135  --  136  K 4.1  --  3.5  CL 101  --  98  CO2 21*  --  25  GLUCOSE 110*  --  104*  BUN 31*  --  25*  CREATININE 0.77  --  0.73  CALCIUM 8.6*  --  8.5*  MG  --  1.9  --    GFR: CrCl cannot be calculated (Unknown ideal weight.). Liver Function Tests: Recent Labs  Lab 10/07/18 1622  AST 20  ALT 14  ALKPHOS 91  BILITOT 1.3*  PROT 5.2*  ALBUMIN 2.8*   No results for input(s): LIPASE, AMYLASE in the last 168 hours. No results for input(s): AMMONIA in the last 168 hours. Coagulation Profile: Recent Labs  Lab 10/07/18 1622 10/08/18 0040  INR 3.0* 3.1*   Cardiac Enzymes: Recent Labs  Lab 10/07/18 1622 10/08/18 0040  TROPONINI 0.03* 0.03*   BNP (last 3 results) No results for input(s): PROBNP in the last 8760 hours.  HbA1C: No results for input(s): HGBA1C in the last 72 hours. CBG: No results for input(s): GLUCAP in the last 168 hours. Lipid Profile: No results for input(s): CHOL, HDL, LDLCALC, TRIG, CHOLHDL, LDLDIRECT in the last 72 hours. Thyroid Function Tests: Recent Labs    10/07/18 1957  TSH 0.869   Anemia Panel: No results for input(s): VITAMINB12, FOLATE, FERRITIN, TIBC, IRON, RETICCTPCT in the last 72 hours. Sepsis Labs: No results for input(s): PROCALCITON, LATICACIDVEN in the last 168 hours.  No results found for this or any previous visit (from the past 240 hour(s)).       Radiology Studies: Dg Chest 2 View  Result Date: 10/07/2018 CLINICAL DATA:  Shortness of breath EXAM: CHEST - 2 VIEW COMPARISON:  None. FINDINGS: Lungs  are hyperexpanded. There are small pleural effusions bilaterally. There is no frank edema or consolidation. Heart is upper normal in size with pulmonary vascularity normal. No adenopathy. Pacemaker leads are present in the regions of the right ventricle and small cardiac vein. Bones are osteoporotic. There is anterior wedging of a midthoracic vertebral body. IMPRESSION: Lungs hyperexpanded with small pleural effusions bilaterally. No edema or consolidation. Heart size within normal limits. Bones osteoporotic. Electronically Signed   By: Bretta Bang III M.D.   On: 10/07/2018 17:51        Scheduled Meds: . ALPRAZolam  0.25 mg Oral QHS  . budesonide  0.5 mg Nebulization Daily  . escitalopram  10 mg Oral Daily  . feeding supplement  1 Container Oral BID BM  . fludrocortisone  0.1 mg Oral Daily  . ipratropium-albuterol  3 mL Nebulization Q6H  . levothyroxine  88 mcg Oral QAC breakfast  . lidocaine  1 patch Transdermal Q24H  . methylPREDNISolone (SOLU-MEDROL) injection  40 mg Intravenous Q6H  . oxybutynin  5 mg Oral QHS  . pravastatin  40 mg Oral q1800  . Warfarin - Pharmacist Dosing Inpatient   Does not apply q1800   Continuous Infusions: .  azithromycin 250 mg (10/08/18 1122)  . cefTRIAXone (ROCEPHIN)  IV 1 g (10/08/18 1032)  . diltiazem (CARDIZEM) infusion 10 mg/hr (10/08/18 1127)     LOS: 0 days    Time spent: 40 minutes   Colin Ellers, MD [facility] Pager [phone]  If 7PM-7AM, please contact night-coverage [email or web address] Password [facility] 10/08/2018, 2:47 PM

## 2018-10-08 NOTE — Progress Notes (Signed)
ANTICOAGULATION CONSULT NOTE  Pharmacy Consult for warfarin Indication: atrial fibrillation  No Known Allergies  Patient Measurements: Weight: 117 lb 4.6 oz (53.2 kg)  Vital Signs: Temp: 97.7 F (36.5 C) (03/14 1115) Temp Source: Oral (03/14 1115) BP: 109/81 (03/14 1115) Pulse Rate: 88 (03/14 1115)  Labs: Recent Labs    10/07/18 1622 10/08/18 0040  HGB 12.0* 12.9*  HCT 36.5* 38.3*  PLT 183 196  LABPROT 30.7* 31.3*  INR 3.0* 3.1*  CREATININE 0.77 0.73  TROPONINI 0.03* 0.03*    CrCl cannot be calculated (Unknown ideal weight.).   Medical History: Past Medical History:  Diagnosis Date  . Adrenal insufficiency (HCC)   . Anemia   . BPH without obstruction/lower urinary tract symptoms   . Chronic anticoagulation   . COPD (chronic obstructive pulmonary disease) (HCC)   . Coronary artery disease   . Hypothyroidism   . Osteoarthritis   . Overactive bladder   . Paroxysmal A-fib (HCC)   . Vitamin D deficiency    Assessment: 40 yoM admitted 3/13 with back pain. Patient is on warfarin PTA for atrial fibrillation. INR 3.0 - on the upper end of therapeutic. Of note patient has had warfarin held the last 2 nights (last dose on 3/10 PM per nursing home [facility]) and last INR 3.2 on 3/12 reported from SNF.  -INR= 3.1 (not on antibiotics and IV steroids; may increase warfarin sensitivity)   PTA dose: warfarin 1 mg PO daily  Goal of Therapy:  INR 2-3 Monitor platelets by anticoagulation protocol: Yes   Plan:  Hold warfarin today Daily INR  Harland German, PharmD Clinical Pharmacist **Pharmacist phone directory can now be found on [email or web address] ([email or web address]).  Listed under [facility].

## 2018-10-09 LAB — CBC
HCT: 37.3 % — ABNORMAL LOW (ref 39.0–52.0)
Hemoglobin: 12.3 g/dL — ABNORMAL LOW (ref 13.0–17.0)
MCH: 30.9 pg (ref 26.0–34.0)
MCHC: 33 g/dL (ref 30.0–36.0)
MCV: 93.7 fL (ref 80.0–100.0)
PLATELETS: 205 10*3/uL (ref 150–400)
RBC: 3.98 MIL/uL — ABNORMAL LOW (ref 4.22–5.81)
RDW: 13.7 % (ref 11.5–15.5)
WBC: 8.1 10*3/uL (ref 4.0–10.5)
nRBC: 0 % (ref 0.0–0.2)

## 2018-10-09 LAB — BASIC METABOLIC PANEL
Anion gap: 13 (ref 5–15)
BUN: 29 mg/dL — AB (ref 8–23)
CO2: 26 mmol/L (ref 22–32)
CREATININE: 0.75 mg/dL (ref 0.61–1.24)
Calcium: 8.6 mg/dL — ABNORMAL LOW (ref 8.9–10.3)
Chloride: 95 mmol/L — ABNORMAL LOW (ref 98–111)
GFR calc Af Amer: >60 mL/min (ref >60)
GFR calc non Af Amer: >60 mL/min (ref >60)
Glucose, Bld: 171 mg/dL — ABNORMAL HIGH (ref 70–99)
Potassium: 4.4 mmol/L (ref 3.5–5.1)
Sodium: 134 mmol/L — ABNORMAL LOW (ref 135–145)

## 2018-10-09 LAB — PROTIME-INR
INR: 4 — ABNORMAL HIGH (ref 0.8–1.2)
PROTHROMBIN TIME: 38.1 s — AB (ref 11.4–15.2)

## 2018-10-09 MED ORDER — AZITHROMYCIN 250 MG PO TABS
500.0000 mg | ORAL_TABLET | Freq: Every day | ORAL | Status: DC
  Administered 2018-10-09 – 2018-10-11 (×3): 500 mg via ORAL
  Filled 2018-10-09 (×3): qty 2

## 2018-10-09 MED ORDER — IPRATROPIUM-ALBUTEROL 0.5-2.5 (3) MG/3ML IN SOLN
3.0000 mL | Freq: Two times a day (BID) | RESPIRATORY_TRACT | Status: DC
  Administered 2018-10-09 – 2018-10-11 (×4): 3 mL via RESPIRATORY_TRACT
  Filled 2018-10-09 (×4): qty 3

## 2018-10-09 MED ORDER — BUDESONIDE 0.5 MG/2ML IN SUSP
0.5000 mg | Freq: Two times a day (BID) | RESPIRATORY_TRACT | Status: DC
  Administered 2018-10-09 – 2018-10-11 (×5): 0.5 mg via RESPIRATORY_TRACT
  Filled 2018-10-09 (×5): qty 2

## 2018-10-09 NOTE — Progress Notes (Signed)
Pt O2 sats began dropping into the low to mid 80's after breathing treatment. Changed finger probe with no improvement. Applied nasal canula @ 2L. Pt sats began to increase. Was able to decrease to 1L for patient comfort. He was coughing up greenish sputum during this event.

## 2018-10-09 NOTE — Progress Notes (Signed)
ANTICOAGULATION CONSULT NOTE  Pharmacy Consult for warfarin Indication: atrial fibrillation  No Known Allergies  Patient Measurements: Weight: 113 lb (51.3 kg)  Vital Signs: Temp: 97.9 F (36.6 C) (03/15 0854) Temp Source: Oral (03/15 0854) BP: 102/73 (03/15 1101) Pulse Rate: 91 (03/15 0854)  Labs: Recent Labs    10/07/18 1622 10/08/18 0040 10/09/18 0417  HGB 12.0* 12.9* 12.3*  HCT 36.5* 38.3* 37.3*  PLT 183 196 205  LABPROT 30.7* 31.3* 38.1*  INR 3.0* 3.1* 4.0*  CREATININE 0.77 0.73 0.75  TROPONINI 0.03* 0.03*  --     CrCl cannot be calculated (Unknown ideal weight.).   Medical History: Past Medical History:  Diagnosis Date  . Adrenal insufficiency (HCC)   . Anemia   . BPH without obstruction/lower urinary tract symptoms   . Chronic anticoagulation   . COPD (chronic obstructive pulmonary disease) (HCC)   . Coronary artery disease   . Hypothyroidism   . Osteoarthritis   . Overactive bladder   . Paroxysmal A-fib (HCC)   . Vitamin D deficiency    Assessment: 57 yoM admitted 3/13 with back pain. Patient is on warfarin PTA for atrial fibrillation. INR 3.0 - on the upper end of therapeutic. Of note patient has had warfarin held the last 2 nights (last dose on 3/10 PM per nursing home [facility]) and last INR 3.2 on 3/12 reported from SNF.  -INR= 4   PTA dose: warfarin 1 mg PO daily  Goal of Therapy:  INR 2-3 Monitor platelets by anticoagulation protocol: Yes   Plan:  Hold warfarin today Daily INR  Harland German, PharmD Clinical Pharmacist **Pharmacist phone directory can now be found on [email or web address] ([email or web address]).  Listed under [facility].

## 2018-10-09 NOTE — Progress Notes (Signed)
PROGRESS NOTE    Vincent Mcneil  LTE:[id number] DOB: Mar 11, 1933 DOA: 10/07/2018 PCP: Linus Salmons, DO     Brief Narrative:  Vincent Mcneil is a 85 year old male with a significant history of steroid-dependent COPD, chronic hypoxic respiratory failure (wears 2L prn), paroxysmal atrial fibrillation on chronic anticoagulation with Coumadin, sick sinus syndrome s/p pacemaker placement, orthostatic hypotension, hypothyroidism presented to the emergency department with complaints of chronic back pain.  X-ray of the thoracic spine concerning about T7 fracture.  Patient was found to be in atrial fibrillation with rapid ventricular rate of 150s.  Patient was given 2 doses of IV Cardizem, started the patient on Cardizem drip.  Patient was on amiodarone in the past, felt very low A. fib burden as of October 2019, taken off of amiodarone.  Patient is more complaining of pain in the coccyx area, denies having any pain in the thoracic area.  Patient is a poor historian.  Work-up in the emergency department with a chest x-ray showed hyperexpansion with the small bilateral pleural effusions.  Patient was also found to have elevated BUN, creatinine ratio.  New events last 24 hours / Subjective: Eating breakfast this morning, denies any complaints.  He denies any chest pain, heart palpitations, shortness of breath.  While he is eating breakfast, his oxygen was taken off.  Satting well on room air.  His heart rate on telemetry ranges from 90-110.  He remains on Cardizem drip.  Assessment & Plan:   Principal Problem:   Paroxysmal atrial fibrillation with RVR (HCC) Active Problems:   Hypothyroidism   Coronary artery disease   Steroid-dependent COPD (HCC)   Elevated troponin   Orthostatic hypotension   Chronic back pain   Pressure injury of skin   Atrial fibrillation with RVR (HCC)   Paroxysmal atrial fibrillation with RVR -He had been on amiodarone as of October 2019 with documentation of very low a fib  burden at that time; he is no longer on amiodarone -Currently on IV Cardizem, plan to transition to p.o. in the next 24 to 48 hours, also on lopressor  -Continue Coumadin  Steroid-dependent COPD; chronic hypoxic respiratory failure -Uses 2 L nasal cannula as needed at home -Per nursing report, overnight, patient had desaturation of his oxygen with productive green sputum.  He does not have any overt wheezes on examination today, currently on room air  -Continue Solu-Medrol, will add azithromycin x5 days  HLD -Continue pravachol   Chronic diastolic CHF -Without acute exacerbation, appears to be euvolemic at this time  CAD; elevated troponin -Denies chest pain, found to have slightly elevated troponin in setting of AF with RVR(demand ischemia)  Chronic low pain -Supportive care  Hypothyroidism -Continue Synthroid -TSH normal   Anxiety -Stable, continue Lexapro and Xanax  Orthostatic hypotension -Continue florinef  Protein-calorie malnutrition  -Continue nutritional supplements  Pressure ulcer, POA -Stage 1 sacrum    DVT prophylaxis: Coumadin Code Status: Full code Family Communication: No family at bedside Disposition Plan: Pending improvement in his heart rate, PT consulted    Consultants:   None  Procedures:   None  Antimicrobials:  Anti-infectives (From admission, onward)   Start     Dose/Rate Route Frequency Ordered Stop   10/08/18 1030  azithromycin (ZITHROMAX) 250 mg in dextrose 5 % 125 mL IVPB  Status:  Discontinued     250 mg 125 mL/hr over 60 Minutes Intravenous Every 24 hours 10/08/18 0928 10/08/18 1458   10/08/18 1000  cefTRIAXone (ROCEPHIN) 1 g in sodium chloride 0.9 %  100 mL IVPB  Status:  Discontinued     1 g 200 mL/hr over 30 Minutes Intravenous Every 24 hours 10/08/18 0945 10/08/18 1458   10/08/18 0930  cefTRIAXone (ROCEPHIN) 1 g in sodium chloride 0.9 % 100 mL IVPB  Status:  Discontinued     1 g 200 mL/hr over 30 Minutes  Intravenous Every 24 hours 10/08/18 0928 10/08/18 0944        Objective: Vitals:   10/09/18 1002 10/09/18 1032 10/09/18 1101 10/09/18 1217  BP: 100/67 92/77 102/73 96/68  Pulse:    80  Resp: 19 (!) 22 17 (!) 22  Temp:    97.6 F (36.4 C)  TempSrc:    Oral  SpO2:    97%  Weight:        Intake/Output Summary (Last 24 hours) at 10/09/2018 1241 Last data filed at 10/09/2018 0830 Gross per 24 hour  Intake 997.06 ml  Output 300 ml  Net 697.06 ml   Filed Weights   10/08/18 0429 10/09/18 0537  Weight: 53.2 kg 51.3 kg    Examination:  General exam: Appears calm and comfortable  Respiratory system: Clear to auscultation. Respiratory effort normal. Cardiovascular system: S1 & S2 heard, irregular rhythm, rate 110 Gastrointestinal system: Abdomen is nondistended, soft and nontender. No organomegaly or masses felt. Normal bowel sounds heard. Central nervous system: Alert and oriented. No focal neurological deficits. Extremities: Symmetric 5 x 5 power. Skin: No rashes, lesions or ulcers Psychiatry: Judgement and insight appear stable, poor insight  Data Reviewed: I have personally reviewed following labs and imaging studies  CBC: Recent Labs  Lab 10/07/18 1622 10/08/18 0040 10/09/18 0417  WBC 11.5* 10.8* 8.1  NEUTROABS 10.1*  --   --   HGB 12.0* 12.9* 12.3*  HCT 36.5* 38.3* 37.3*  MCV 98.1 93.6 93.7  PLT 183 196 205   Basic Metabolic Panel: Recent Labs  Lab 10/07/18 1622 10/07/18 1957 10/08/18 0040 10/09/18 0417  NA 135  --  136 134*  K 4.1  --  3.5 4.4  CL 101  --  98 95*  CO2 21*  --  25 26  GLUCOSE 110*  --  104* 171*  BUN 31*  --  25* 29*  CREATININE 0.77  --  0.73 0.75  CALCIUM 8.6*  --  8.5* 8.6*  MG  --  1.9  --   --    GFR: CrCl cannot be calculated (Unknown ideal weight.). Liver Function Tests: Recent Labs  Lab 10/07/18 1622  AST 20  ALT 14  ALKPHOS 91  BILITOT 1.3*  PROT 5.2*  ALBUMIN 2.8*   No results for input(s): LIPASE, AMYLASE in the  last 168 hours. No results for input(s): AMMONIA in the last 168 hours. Coagulation Profile: Recent Labs  Lab 10/07/18 1622 10/08/18 0040 10/09/18 0417  INR 3.0* 3.1* 4.0*   Cardiac Enzymes: Recent Labs  Lab 10/07/18 1622 10/08/18 0040  TROPONINI 0.03* 0.03*   BNP (last 3 results) No results for input(s): PROBNP in the last 8760 hours. HbA1C: No results for input(s): HGBA1C in the last 72 hours. CBG: No results for input(s): GLUCAP in the last 168 hours. Lipid Profile: No results for input(s): CHOL, HDL, LDLCALC, TRIG, CHOLHDL, LDLDIRECT in the last 72 hours. Thyroid Function Tests: Recent Labs    10/07/18 1957  TSH 0.869   Anemia Panel: No results for input(s): VITAMINB12, FOLATE, FERRITIN, TIBC, IRON, RETICCTPCT in the last 72 hours. Sepsis Labs: No results for input(s): PROCALCITON, LATICACIDVEN in  the last 168 hours.  No results found for this or any previous visit (from the past 240 hour(s)).     Radiology Studies: Dg Chest 2 View  Result Date: 10/07/2018 CLINICAL DATA:  Shortness of breath EXAM: CHEST - 2 VIEW COMPARISON:  None. FINDINGS: Lungs are hyperexpanded. There are small pleural effusions bilaterally. There is no frank edema or consolidation. Heart is upper normal in size with pulmonary vascularity normal. No adenopathy. Pacemaker leads are present in the regions of the right ventricle and small cardiac vein. Bones are osteoporotic. There is anterior wedging of a midthoracic vertebral body. IMPRESSION: Lungs hyperexpanded with small pleural effusions bilaterally. No edema or consolidation. Heart size within normal limits. Bones osteoporotic. Electronically Signed   By: Bretta Bang III M.D.   On: 10/07/2018 17:51   Dg Sacrum/coccyx  Result Date: 10/08/2018 CLINICAL DATA:  Chronic back pain EXAM: SACRUM AND COCCYX - 2+ VIEW COMPARISON:  None. FINDINGS: Mild degenerative changes in the hips bilaterally with joint space narrowing and spurring. No acute  bony abnormality. Specifically, no fracture, subluxation, or dislocation. No visible sacral or coccygeal fracture. IMPRESSION: No acute bony abnormality. Electronically Signed   By: Charlett Nose M.D.   On: 10/08/2018 16:57      Scheduled Meds: . ALPRAZolam  0.25 mg Oral QHS  . budesonide  0.5 mg Nebulization BID  . escitalopram  10 mg Oral Daily  . feeding supplement  1 Container Oral BID BM  . fludrocortisone  0.1 mg Oral Daily  . ipratropium-albuterol  3 mL Nebulization BID  . levothyroxine  75 mcg Oral QAC breakfast  . lidocaine  1 patch Transdermal Q24H  . methylPREDNISolone (SOLU-MEDROL) injection  40 mg Intravenous Q6H  . metoprolol tartrate  25 mg Oral BID  . oxybutynin  5 mg Oral QHS  . pravastatin  40 mg Oral q1800  . Warfarin - Pharmacist Dosing Inpatient   Does not apply q1800   Continuous Infusions: . diltiazem (CARDIZEM) infusion 10 mg/hr (10/08/18 2202)     LOS: 1 day    Time spent: 35 minutes   Noralee Stain, DO [facility] [email or web address] 10/09/2018, 12:41 PM

## 2018-10-09 NOTE — Plan of Care (Signed)
  Problem: Clinical Measurements: Goal: Respiratory complications will improve Outcome: Progressing Goal: Cardiovascular complication will be avoided Outcome: Progressing   

## 2018-10-10 DIAGNOSIS — E43 Unspecified severe protein-calorie malnutrition: Secondary | ICD-10-CM

## 2018-10-10 LAB — BASIC METABOLIC PANEL
Anion gap: 9 (ref 5–15)
BUN: 44 mg/dL — ABNORMAL HIGH (ref 8–23)
CO2: 26 mmol/L (ref 22–32)
Calcium: 9 mg/dL (ref 8.9–10.3)
Chloride: 96 mmol/L — ABNORMAL LOW (ref 98–111)
Creatinine, Ser: 1.1 mg/dL (ref 0.61–1.24)
GFR calc non Af Amer: >60 mL/min (ref >60)
Glucose, Bld: 171 mg/dL — ABNORMAL HIGH (ref 70–99)
Potassium: 4 mmol/L (ref 3.5–5.1)
Sodium: 131 mmol/L — ABNORMAL LOW (ref 135–145)

## 2018-10-10 LAB — PROTIME-INR
INR: 3.2 — ABNORMAL HIGH (ref 0.8–1.2)
Prothrombin Time: 32.2 seconds — ABNORMAL HIGH (ref 11.4–15.2)

## 2018-10-10 LAB — CBC
HCT: 35.2 % — ABNORMAL LOW (ref 39.0–52.0)
HEMOGLOBIN: 11.9 g/dL — AB (ref 13.0–17.0)
MCH: 31.7 pg (ref 26.0–34.0)
MCHC: 33.8 g/dL (ref 30.0–36.0)
MCV: 93.9 fL (ref 80.0–100.0)
Platelets: 214 10*3/uL (ref 150–400)
RBC: 3.75 MIL/uL — ABNORMAL LOW (ref 4.22–5.81)
RDW: 13.8 % (ref 11.5–15.5)
WBC: 15.5 10*3/uL — ABNORMAL HIGH (ref 4.0–10.5)
nRBC: 0 % (ref 0.0–0.2)

## 2018-10-10 MED ORDER — DILTIAZEM HCL 60 MG PO TABS
60.0000 mg | ORAL_TABLET | Freq: Four times a day (QID) | ORAL | Status: DC
  Administered 2018-10-10 – 2018-10-11 (×4): 60 mg via ORAL
  Filled 2018-10-10 (×4): qty 1

## 2018-10-10 MED ORDER — WARFARIN 0.5 MG HALF TABLET
0.5000 mg | ORAL_TABLET | Freq: Once | ORAL | Status: AC
  Administered 2018-10-10: 0.5 mg via ORAL
  Filled 2018-10-10: qty 1

## 2018-10-10 MED ORDER — ENSURE ENLIVE PO LIQD
237.0000 mL | Freq: Two times a day (BID) | ORAL | Status: DC
  Administered 2018-10-10 – 2018-10-11 (×2): 237 mL via ORAL

## 2018-10-10 MED ORDER — PREDNISONE 20 MG PO TABS
50.0000 mg | ORAL_TABLET | Freq: Every day | ORAL | Status: DC
  Administered 2018-10-11: 50 mg via ORAL
  Filled 2018-10-10: qty 1

## 2018-10-10 NOTE — Progress Notes (Addendum)
PROGRESS NOTE    Vincent Mcneil  MVH:[id number] DOB: 05/17/1933 DOA: 10/07/2018 PCP: Linus SalmonsAsbury, Tori, DO     Brief Narrative:  Vincent Copierugene Myler is a 85 year old male with a significant history of steroid-dependent COPD, chronic hypoxic respiratory failure (wears 2L prn), paroxysmal atrial fibrillation on chronic anticoagulation with Coumadin, sick sinus syndrome s/p pacemaker placement, orthostatic hypotension, hypothyroidism presented to the emergency department with complaints of chronic back pain.  X-ray of the thoracic spine concerning about T7 fracture.  Patient was found to be in atrial fibrillation with rapid ventricular rate of 150s.  Patient was given 2 doses of IV Cardizem, started the patient on Cardizem drip.  Patient was on amiodarone in the past, felt very low A. fib burden as of October 2019, taken off of amiodarone.  Patient is more complaining of pain in the coccyx area, denies having any pain in the thoracic area.  Patient is a poor historian.  Work-up in the emergency department with a chest x-ray showed hyperexpansion with the small bilateral pleural effusions.  Patient was also found to have elevated BUN, creatinine ratio.  New events last 24 hours / Subjective: No issues overnight, somnolent and hard of hearing, difficult to gather any history this morning. Does awaken briefly with verbal stimuli   Assessment & Plan:   Principal Problem:   Paroxysmal atrial fibrillation with RVR (HCC) Active Problems:   Hypothyroidism   Coronary artery disease   Steroid-dependent COPD (HCC)   Elevated troponin   Orthostatic hypotension   Chronic back pain   Pressure injury of skin   Atrial fibrillation with RVR (HCC)   Paroxysmal atrial fibrillation with RVR -He had been on amiodarone as of October 2019 with documentation of very low a fib burden at that time; he is no longer on amiodarone -Continue Coumadin -Transition to Cardizem 60mg  q6h, if rate remains well controlled, well  transition to long-acting for discharge   Acute bronchitis -Steroid-dependent COPD; chronic hypoxic respiratory failure -Uses 2 L nasal cannula as needed at home -Per nursing report, overnight 3/14, patient had desaturation of his oxygen with productive green sputum.  He does not have any overt wheezes on examination today, currently on room air  -Continue prednisone, azithromycin   HLD -Continue pravachol   Chronic diastolic CHF -Without acute exacerbation, appears to be euvolemic at this time  CAD; elevated troponin -Found to have slightly elevated troponin in setting of AF with RVR(demand ischemia)  Chronic low pain -Supportive care  Hypothyroidism -Continue Synthroid -TSH normal   Anxiety -Stable, continue Lexapro and Xanax  Orthostatic hypotension -Continue florinef  Protein-calorie malnutrition  -Continue nutritional supplements  Pressure ulcer, POA -Stage 1 sacrum    DVT prophylaxis: Coumadin Code Status: Full code Family Communication: No family at bedside, spoke with son over the phone Disposition Plan: PT ordered for dispo planning, likely return to [facility], hopeful discharge 3/17 as long as doing well on PO cardizem    Consultants:   None  Procedures:   None  Antimicrobials:  Anti-infectives (From admission, onward)   Start     Dose/Rate Route Frequency Ordered Stop   10/09/18 1400  azithromycin (ZITHROMAX) tablet 500 mg     500 mg Oral Daily 10/09/18 1257 10/14/18 0959   10/08/18 1030  azithromycin (ZITHROMAX) 250 mg in dextrose 5 % 125 mL IVPB  Status:  Discontinued     250 mg 125 mL/hr over 60 Minutes Intravenous Every 24 hours 10/08/18 0928 10/08/18 1458   10/08/18 1000  cefTRIAXone (ROCEPHIN) 1  g in sodium chloride 0.9 % 100 mL IVPB  Status:  Discontinued     1 g 200 mL/hr over 30 Minutes Intravenous Every 24 hours 10/08/18 0945 10/08/18 1458   10/08/18 0930  cefTRIAXone (ROCEPHIN) 1 g in sodium chloride 0.9 % 100 mL IVPB   Status:  Discontinued     1 g 200 mL/hr over 30 Minutes Intravenous Every 24 hours 10/08/18 0928 10/08/18 0944       Objective: Vitals:   10/09/18 2346 10/10/18 0406 10/10/18 0729 10/10/18 0903  BP: 95/68 98/85  104/84  Pulse: 70 78  77  Resp: 18 16  15   Temp: 97.9 F (36.6 C) 98 F (36.7 C)    TempSrc: Oral Oral    SpO2: 98% 98% 96% 96%  Weight:  51.9 kg      Intake/Output Summary (Last 24 hours) at 10/10/2018 1030 Last data filed at 10/09/2018 1845 Gross per 24 hour  Intake 480 ml  Output 200 ml  Net 280 ml   Filed Weights   10/08/18 0429 10/09/18 0537 10/10/18 0406  Weight: 53.2 kg 51.3 kg 51.9 kg    Examination: General exam: Appears calm and comfortable  Respiratory system: Clear to auscultation. Respiratory effort normal. Cardiovascular system: S1 & S2 heard, irreg rhythm rate 70-80s. No JVD, murmurs, rubs, gallops or clicks. No pedal edema. Gastrointestinal system: Abdomen is nondistended, soft and nontender. No organomegaly or masses felt. Normal bowel sounds heard. Central nervous system: Alert to voice  Extremities: Symmetric  Skin: No rashes, lesions or ulcers  Data Reviewed: I have personally reviewed following labs and imaging studies  CBC: Recent Labs  Lab 10/07/18 1622 10/08/18 0040 10/09/18 0417 10/10/18 0353  WBC 11.5* 10.8* 8.1 15.5*  NEUTROABS 10.1*  --   --   --   HGB 12.0* 12.9* 12.3* 11.9*  HCT 36.5* 38.3* 37.3* 35.2*  MCV 98.1 93.6 93.7 93.9  PLT 183 196 205 214   Basic Metabolic Panel: Recent Labs  Lab 10/07/18 1622 10/07/18 1957 10/08/18 0040 10/09/18 0417 10/10/18 0353  NA 135  --  136 134* 131*  K 4.1  --  3.5 4.4 4.0  CL 101  --  98 95* 96*  CO2 21*  --  25 26 26   GLUCOSE 110*  --  104* 171* 171*  BUN 31*  --  25* 29* 44*  CREATININE 0.77  --  0.73 0.75 1.10  CALCIUM 8.6*  --  8.5* 8.6* 9.0  MG  --  1.9  --   --   --    GFR: CrCl cannot be calculated (Unknown ideal weight.). Liver Function Tests: Recent Labs   Lab 10/07/18 1622  AST 20  ALT 14  ALKPHOS 91  BILITOT 1.3*  PROT 5.2*  ALBUMIN 2.8*   No results for input(s): LIPASE, AMYLASE in the last 168 hours. No results for input(s): AMMONIA in the last 168 hours. Coagulation Profile: Recent Labs  Lab 10/07/18 1622 10/08/18 0040 10/09/18 0417 10/10/18 0353  INR 3.0* 3.1* 4.0* 3.2*   Cardiac Enzymes: Recent Labs  Lab 10/07/18 1622 10/08/18 0040  TROPONINI 0.03* 0.03*   BNP (last 3 results) No results for input(s): PROBNP in the last 8760 hours. HbA1C: No results for input(s): HGBA1C in the last 72 hours. CBG: No results for input(s): GLUCAP in the last 168 hours. Lipid Profile: No results for input(s): CHOL, HDL, LDLCALC, TRIG, CHOLHDL, LDLDIRECT in the last 72 hours. Thyroid Function Tests: Recent Labs    10/07/18  1957  TSH 0.869   Anemia Panel: No results for input(s): VITAMINB12, FOLATE, FERRITIN, TIBC, IRON, RETICCTPCT in the last 72 hours. Sepsis Labs: No results for input(s): PROCALCITON, LATICACIDVEN in the last 168 hours.  No results found for this or any previous visit (from the past 240 hour(s)).     Radiology Studies: Dg Sacrum/coccyx  Result Date: 10/08/2018 CLINICAL DATA:  Chronic back pain EXAM: SACRUM AND COCCYX - 2+ VIEW COMPARISON:  None. FINDINGS: Mild degenerative changes in the hips bilaterally with joint space narrowing and spurring. No acute bony abnormality. Specifically, no fracture, subluxation, or dislocation. No visible sacral or coccygeal fracture. IMPRESSION: No acute bony abnormality. Electronically Signed   By: Charlett Nose M.D.   On: 10/08/2018 16:57      Scheduled Meds: . ALPRAZolam  0.25 mg Oral QHS  . azithromycin  500 mg Oral Daily  . budesonide  0.5 mg Nebulization BID  . diltiazem  60 mg Oral Q6H  . escitalopram  10 mg Oral Daily  . feeding supplement  1 Container Oral BID BM  . fludrocortisone  0.1 mg Oral Daily  . ipratropium-albuterol  3 mL Nebulization BID  .  levothyroxine  75 mcg Oral QAC breakfast  . lidocaine  1 patch Transdermal Q24H  . metoprolol tartrate  25 mg Oral BID  . oxybutynin  5 mg Oral QHS  . pravastatin  40 mg Oral q1800  . [START ON 10/11/2018] predniSONE  50 mg Oral Q breakfast  . warfarin  0.5 mg Oral ONCE-1800  . Warfarin - Pharmacist Dosing Inpatient   Does not apply q1800   Continuous Infusions:    LOS: 2 days    Time spent: 25 minutes   Noralee Stain, DO [facility] [email or web address] 10/10/2018, 10:30 AM

## 2018-10-10 NOTE — Progress Notes (Addendum)
Initial Nutrition Assessment  DOCUMENTATION CODES:   Severe malnutrition in context of chronic illness, Underweight  INTERVENTION:    Ensure Enlive po BID, each supplement provides 350 kcal and 20 grams of protein  Boost Breeze po BID, each supplement provides 250 kcal and 9 grams of protein  NUTRITION DIAGNOSIS:   Severe Malnutrition related to chronic illness(COPD, CHF, CAD) as evidenced by severe fat depletion, severe muscle depletion   GOAL:   Patient will meet greater than or equal to 90% of their needs  MONITOR:   PO intake, Supplement acceptance, Labs, Skin, Weight trends, I & O's  REASON FOR ASSESSMENT:   Consult Assessment of nutrition requirement/status  ASSESSMENT:   85 yo Male with a significant history of steroid-dependent COPD, chronic hypoxic respiratory failure, sick sinus syndrome s/p pacemaker placement, orthostatic hypotension, hypothyroidism presented to the emergency department with complaints of chronic back pain.  Pt admitted with A-fibrillation with RVR and back pain.  RD spoke with pt at bedside. He was admitted from [facility]. Reports he ate lunch well; has Ensure Enlive on tray table.  Pt has however Boost Breeze ordered BID. PO intake 80% per flowsheet records. He states his weight has been stable.  Labs & medications reviewed. Na 131 (L).  NUTRITION - FOCUSED PHYSICAL EXAM:    Most Recent Value  Orbital Region  Moderate depletion  Upper Arm Region  Severe depletion  Thoracic and Lumbar Region  Severe depletion  Buccal Region  Moderate depletion  Temple Region  Moderate depletion  Clavicle Bone Region  Severe depletion  Clavicle and Acromion Bone Region  Severe depletion  Scapular Bone Region  Severe depletion  Dorsal Hand  Moderate depletion  Patellar Region  Severe depletion  Anterior Thigh Region  Severe depletion  Posterior Calf Region  Severe depletion  Edema (RD Assessment)  None     Diet Order:   Diet Order             Diet Heart Room service appropriate? Yes with Assist; Fluid consistency: Thin  Diet effective now             EDUCATION NEEDS:   No education needs have been identified at this time  Skin:  Skin Assessment: Skin Integrity Issues: Skin Integrity Issues:: Stage I Stage I: sacrum  Last BM:  N/A  Height:   Ht Readings from Last 1 Encounters:  10/10/18 5\' 9"  (1.753 m)   Weight:   Wt Readings from Last 1 Encounters:  10/10/18 51.9 kg   BMI:  Body mass index is 16.9 kg/m.  Estimated Nutritional Needs:   Kcal:  1400-1600  Protein:  65-80 gm  Fluid:  >/= 1.5 L  Maureen Chatters, RD, LDN Pager #: [phone] After-Hours Pager #: [phone]

## 2018-10-10 NOTE — Plan of Care (Signed)

## 2018-10-10 NOTE — [place] FL2 (Signed)
[place] MEDICAID FL2 LEVEL OF CARE SCREENING TOOL     IDENTIFICATION  Patient Name: Vincent Mcneil Birthdate: 05/08/1933 Sex: male Admission Date (Current Location): 10/07/2018  [place] and [place] Number:  Haynes Bast [id number] Facility and Address:  [facility] [place], [address] [place], [place] [address]      Provider Number: [id number]  Attending Physician Name and Address:  Noralee Stain, DO  Relative Name and Phone Number:  Taiten Belisle; son; [phone]    Current Level of Care:  Hospital Recommended Level of Care:  Skilled Nursing Facility Prior Approval Number:    Date Approved/Denied:   PASRR Number: [id number]  Discharge Plan: SNF    Current Diagnoses: Patient Active Problem List   Diagnosis Date Noted  . Pressure injury of skin 10/08/2018  . Atrial fibrillation with RVR (HCC) 10/08/2018  . Paroxysmal atrial fibrillation with RVR (HCC) 10/07/2018  . Hypothyroidism 10/07/2018  . Coronary artery disease 10/07/2018  . Steroid-dependent COPD (HCC) 10/07/2018  . Elevated troponin 10/07/2018  . Orthostatic hypotension 10/07/2018  . Chronic back pain 10/07/2018    Orientation RESPIRATION BLADDER Height & Weight     Self, Place  O2(nasal canula 2L) Continent Weight: 114 lb 6.7 oz (51.9 kg) Height:     BEHAVIORAL SYMPTOMS/MOOD NEUROLOGICAL BOWEL NUTRITION STATUS      Continent Diet(see discharge summary)  AMBULATORY STATUS COMMUNICATION OF NEEDS Skin   Extensive Assist Verbally PU Stage and Appropriate Care, Other (Comment)(ecchymosis on bilateral arms; PU stage 1 on sacrum with dressing) PU Stage 1 Dressing: (foam dressing)                     Personal Care Assistance Level of Assistance  Bathing, Feeding, Dressing Bathing Assistance: Maximum assistance Feeding assistance: Independent Dressing Assistance: Maximum assistance     Functional Limitations Info  Sight, Hearing, Speech Sight Info: Impaired Hearing Info:  Impaired Speech Info: Adequate    SPECIAL CARE FACTORS FREQUENCY  PT (By licensed PT), OT (By licensed OT)     PT Frequency: 5x week OT Frequency: 5x week            Contractures Contractures Info: Not present    Additional Factors Info  Code Status, Allergies, Psychotropic Code Status Info: Full Code Allergies Info: No Known Allergies Psychotropic Info: ALPRAZolam (XANAX) tablet 0.25 mg daily at bedtime PO; escitalopram (LEXAPRO) tablet 10 mg daily PO         Current Medications (10/10/2018):  This is the current hospital active medication list Current Facility-Administered Medications  Medication Dose Route Frequency Provider Last Rate Last Dose  . acetaminophen (TYLENOL) tablet 650 mg  650 mg Oral Q4H PRN Opyd, Lavone Neri, MD      . ALPRAZolam Prudy Feeler) tablet 0.25 mg  0.25 mg Oral QHS Opyd, Lavone Neri, MD   0.25 mg at 10/09/18 2126  . azithromycin (ZITHROMAX) tablet 500 mg  500 mg Oral Daily Noralee Stain, DO   500 mg at 10/10/18 0906  . budesonide (PULMICORT) nebulizer solution 0.5 mg  0.5 mg Nebulization BID Noralee Stain, DO   0.5 mg at 10/10/18 6384  . diltiazem (CARDIZEM) tablet 60 mg  60 mg Oral Q6H Noralee Stain, DO   60 mg at 10/10/18 1120  . escitalopram (LEXAPRO) tablet 10 mg  10 mg Oral Daily Opyd, Lavone Neri, MD   10 mg at 10/10/18 0906  . feeding supplement (BOOST / RESOURCE BREEZE) liquid 1 Container  1 Container Oral BID BM Opyd, Lavone Neri,  MD   1 Container at 10/10/18 0907  . fludrocortisone (FLORINEF) tablet 0.1 mg  0.1 mg Oral Daily Opyd, Lavone Neri, MD   0.1 mg at 10/10/18 0906  . HYDROcodone-acetaminophen (NORCO/VICODIN) 5-325 MG per tablet 1 tablet  1 tablet Oral Q6H PRN Opyd, Lavone Neri, MD      . ipratropium-albuterol (DUONEB) 0.5-2.5 (3) MG/3ML nebulizer solution 3 mL  3 mL Nebulization Q6H PRN Opyd, Lavone Neri, MD      . ipratropium-albuterol (DUONEB) 0.5-2.5 (3) MG/3ML nebulizer solution 3 mL  3 mL Nebulization BID Noralee Stain, DO   3 mL at 10/10/18  0728  . levothyroxine (SYNTHROID, LEVOTHROID) tablet 75 mcg  75 mcg Oral QAC breakfast Susa Griffins, MD   75 mcg at 10/10/18 0511  . lidocaine (LIDODERM) 5 % 1 patch  1 patch Transdermal Q24H Opyd, Lavone Neri, MD   1 patch at 10/10/18 0906  . metoprolol tartrate (LOPRESSOR) tablet 25 mg  25 mg Oral BID Susa Griffins, MD   25 mg at 10/10/18 0906  . ondansetron (ZOFRAN) injection 4 mg  4 mg Intravenous Q6H PRN Opyd, Lavone Neri, MD      . oxybutynin (DITROPAN) tablet 5 mg  5 mg Oral QHS Opyd, Lavone Neri, MD   5 mg at 10/09/18 2126  . pravastatin (PRAVACHOL) tablet 40 mg  40 mg Oral q1800 Opyd, Lavone Neri, MD   40 mg at 10/09/18 1630  . [START ON 10/11/2018] predniSONE (DELTASONE) tablet 50 mg  50 mg Oral Q breakfast Noralee Stain, DO      . warfarin (COUMADIN) tablet 0.5 mg  0.5 mg Oral ONCE-1800 Daylene Posey, RPH      . Warfarin - Pharmacist Dosing Inpatient   Does not apply q1800 [facility]   Stopped at 10/08/18 1800     Discharge Medications: Please see discharge summary for a list of discharge medications.  Relevant Imaging Results:  Relevant Lab Results:   Additional Information SS#[id number] [place], [place]

## 2018-10-10 NOTE — TOC Initial Note (Signed)
Transition of Care [facility]) - Initial/Assessment Note    Patient Details  Name: Vincent Mcneil MRN: [id number] Date of Birth: 08-18-1932  [facility]) CM/SW Contact:    Doy Hutching, LCSWA Phone Number: 10/10/2018, 12:53 PM  Clinical Narrative:                 Spoke with pt son Thayer Ohm via telephone, introduced self, role, reason for visit. Pt son confirms pt from SNF at [facility] where he has been receiving rehab "for about a month."   Pt three children are all involved with pt care, Thayer Ohm being the closes proximity Vernona Rieger is in [place] and Larita Fife is in [place]). Plan is for pt to return to SNF at discharge. Will follow for progress and send referral to [facility]. Pt will need reauthorization prior to d/c.  Expected Discharge Plan: Skilled Nursing Facility Barriers to Discharge: Continued Medical Work up, English as a second language teacher   Patient Goals and CMS Choice Patient states their goals for this hospitalization and ongoing recovery are:: to return to [employer].gov Compare Post Acute Care list provided to:: Patient Choice offered to / list presented to : Adult Children  Expected Discharge Plan and Services Expected Discharge Plan: Skilled Nursing Facility   Post Acute Care Choice: Skilled Nursing Facility Living arrangements for the past 2 months: Skilled Nursing Facility, Single Family Home                  Prior Living Arrangements/Services Living arrangements for the past 2 months: Skilled Nursing Facility, Single Family Home Lives with:: Facility Resident, Self Patient language and need for interpreter reviewed:: No Do you feel safe going back to the place where you live?: Yes      Need for Family Participation in Patient Care: Yes (Comment)(decision making) Care giver support system in place?: Yes (comment)(adult children)   Criminal Activity/Legal Involvement Pertinent to Current Situation/Hospitalization: No - Comment as needed  Activities of  Daily Living      Permission Sought/Granted Permission sought to share information with : Family Supports Permission granted to share information with : No(pt with fluctuating orientation)  Share Information with NAME: Dyann Kief; Larita Fife; [facility]  Permission granted to share info w AGENCY: SNF- [facility] [place]  Permission granted to share info w Relationship: son; daughter; daughter  Permission granted to share info w Contact Information: [phone] Thayer Ohm); [phone] Vernona Rieger)   Emotional Assessment Appearance:: Appears stated age Attitude/Demeanor/Rapport: Unable to Assess Affect (typically observed): Unable to Assess Orientation: : Oriented to Self, Oriented to Place Alcohol / Substance Use: Not Applicable Psych Involvement: No (comment)  Admission diagnosis:  Atrial fibrillation with RVR (HCC) [I48.91] Chronic low back pain, unspecified back pain laterality, unspecified whether sciatica present [M54.5, G89.29] Paroxysmal atrial fibrillation with RVR (HCC) [I48.0] Patient Active Problem List   Diagnosis Date Noted  . Pressure injury of skin 10/08/2018  . Atrial fibrillation with RVR (HCC) 10/08/2018  . Paroxysmal atrial fibrillation with RVR (HCC) 10/07/2018  . Hypothyroidism 10/07/2018  . Coronary artery disease 10/07/2018  . Steroid-dependent COPD (HCC) 10/07/2018  . Elevated troponin 10/07/2018  . Orthostatic hypotension 10/07/2018  . Chronic back pain 10/07/2018   PCP:  Linus Salmons, DO Pharmacy:  No Pharmacies Listed    Social Determinants of Health (SDOH) Interventions    Readmission Risk Interventions 30 Day Unplanned Readmission Risk Score     ED to Hosp-Admission (Current) from 10/07/2018 in [place] 6E Progressive Care  30 Day Unplanned Readmission Risk  Score (%)  13 Filed at 10/10/2018 1200     This score is the patient's risk of an unplanned readmission within 30 days of being discharged (0 -100%). The score is based on dignosis, age,  lab data, medications, orders, and past utilization.   Low:  0-14.9   Medium: 15-21.9   High: 22-29.9   Extreme: 30 and above       No flowsheet data found.

## 2018-10-10 NOTE — Progress Notes (Signed)
ANTICOAGULATION CONSULT NOTE  Pharmacy Consult for warfarin Indication: atrial fibrillation  No Known Allergies  Patient Measurements: Weight: 114 lb 6.7 oz (51.9 kg)  Vital Signs: Temp: 98 F (36.7 C) (03/16 0406) Temp Source: Oral (03/16 0406) BP: 98/85 (03/16 0406) Pulse Rate: 78 (03/16 0406)  Labs: Recent Labs    10/07/18 1622 10/08/18 0040 10/09/18 0417 10/10/18 0353  HGB 12.0* 12.9* 12.3* 11.9*  HCT 36.5* 38.3* 37.3* 35.2*  PLT 183 196 205 214  LABPROT 30.7* 31.3* 38.1* 32.2*  INR 3.0* 3.1* 4.0* 3.2*  CREATININE 0.77 0.73 0.75 1.10  TROPONINI 0.03* 0.03*  --   --     CrCl cannot be calculated (Unknown ideal weight.).   Medical History: Past Medical History:  Diagnosis Date  . Adrenal insufficiency (HCC)   . Anemia   . BPH without obstruction/lower urinary tract symptoms   . Chronic anticoagulation   . COPD (chronic obstructive pulmonary disease) (HCC)   . Coronary artery disease   . Hypothyroidism   . Osteoarthritis   . Overactive bladder   . Paroxysmal A-fib (HCC)   . Vitamin D deficiency    Assessment: 53 yoM admitted 3/13 with back pain. Patient is on warfarin PTA for atrial fibrillation. INR 3.0 - on the upper end of therapeutic. Of note patient has had warfarin held the last 2 nights (last dose on 3/10 PM per nursing home [facility]) and last INR 3.2 on 3/12 reported from SNF.   PTA dose: warfarin 1 mg PO daily  INR downtrend from 4>3.2, last dose 3/13.  PO intake improved from 50-80% of meals.  On azithromycin tx until 3/20.  Goal of Therapy:  INR 2-3 Monitor platelets by anticoagulation protocol: Yes   Plan:  Warfarin 0.5mg  PO x1 tonight Daily INR, s/s bleeding  Daylene Posey, PharmD Clinical Pharmacist Please check AMION for all [facility] Pharmacy numbers 10/10/2018 9:00 AM

## 2018-10-10 NOTE — Evaluation (Signed)
Physical Therapy Evaluation Patient Details Name: Vincent Mcneil MRN: [id number] DOB: [birth year] Today's Date: 10/10/2018   History of Present Illness  Patient is a 85 y/o male who presents from SNF with back pain and found to have A-fib with RVR. CXR-hyperexpansion with small bil pleural effusions. Also noted to have T7 comp fx. PMH includes CAD, COPD, paroxysmal A-fib.   Clinical Impression  Patient presents with generalized weakness, dizziness, decreased activity tolerance, impaired balance, impaired cognition and impaired mobility s/p above. Tolerated bed mobility with mod-Max A and able to sit EOB ~10 mins with Min guard-Mod A due to posterior lean. Pt fatigues quickly and limited today by dizziness. Sp02 dropped to mid 80s sitting EOB. BP dropped slightly. Supine BP 106/76 and sitting BP 95/82. Pt not able to provide PLOF or history. Per chart, he is from [facility] SNF. Will follow acutely to maximize independence and mobility prior to return home.    Follow Up Recommendations SNF    Equipment Recommendations  None recommended by PT    Recommendations for Other Services       Precautions / Restrictions Precautions Precautions: Fall Precaution Comments: watch 02 Restrictions Weight Bearing Restrictions: No      Mobility  Bed Mobility Overal bed mobility: Needs Assistance Bed Mobility: Supine to Sit;Sit to Supine     Supine to sit: Max assist;HOB elevated Sit to supine: Mod assist;HOB elevated   General bed mobility comments: Assist to bring LEs to EOB and elevate trunk to get to EOB. Step by step cues, "go slow." Posterior lean. Bring LEs into bed to return to supine.   Transfers                 General transfer comment: No initiation noted to attempt standing- reports dizziness. Sp02 dropped to mid 80s on 2L/min 02.   Ambulation/Gait                Stairs            Wheelchair Mobility    Modified Rankin (Stroke Patients Only)       Balance  Overall balance assessment: Needs assistance Sitting-balance support: Feet supported;Bilateral upper extremity supported Sitting balance-Leahy Scale: Poor Sitting balance - Comments: Able to sit EOB with close min guard -Mod A due to posterior lean and fatigue. Dizziness limiting.  Postural control: Posterior lean                                   Pertinent Vitals/Pain Pain Assessment: Faces Faces Pain Scale: Hurts little more Pain Location: RUE with movement Pain Descriptors / Indicators: Grimacing;Guarding Pain Intervention(s): Monitored during session;Repositioned    Home Living Family/patient expects to be discharged to:: Skilled nursing facility([facility] nursing facility)                      Prior Function Level of Independence: Needs assistance   Gait / Transfers Assistance Needed: Reports walking independently PTA- however seemed comfortable grabbing onto walker to sit.      Comments: Pt not a great historian and not able to provide information related to PLOF. Difficult to understand as well, dentures not in.      Hand Dominance        Extremity/Trunk Assessment   Upper Extremity Assessment Upper Extremity Assessment: Defer to OT evaluation    Lower Extremity Assessment Lower Extremity Assessment: Generalized weakness(not able to lift BLEs off bed against  gravity. )    Cervical / Trunk Assessment Cervical / Trunk Assessment: Kyphotic  Communication   Communication: Expressive difficulties(mumbling like speech, does not have dentures in)  Cognition Arousal/Alertness: Lethargic Behavior During Therapy: Flat affect Overall Cognitive Status: No family/caregiver present to determine baseline cognitive functioning Area of Impairment: Orientation;Problem solving;Following commands                 Orientation Level: Disoriented to;Time;Situation     Following Commands: Follows one step commands inconsistently;Follows one step commands  with increased time     Problem Solving: Slow processing;Decreased initiation;Difficulty sequencing;Requires verbal cues;Requires tactile cues General Comments: "November" "I got hit by a tree" referring to pain in [place].       General Comments      Exercises     Assessment/Plan    PT Assessment Patient needs continued PT services  PT Problem List Decreased strength;Decreased balance;Decreased cognition;Cardiopulmonary status limiting activity;Decreased mobility;Decreased range of motion;Decreased activity tolerance       PT Treatment Interventions Functional mobility training;Balance training;Patient/family education;DME instruction;Gait training;Therapeutic activities;Wheelchair mobility training;Therapeutic exercise;Cognitive remediation    PT Goals (Current goals can be found in the Care Plan section)  Acute Rehab PT Goals Patient Stated Goal: none stated, "i want to lay down." PT Goal Formulation: With patient Time For Goal Achievement: 10/24/18 Potential to Achieve Goals: Fair    Frequency Min 2X/week   Barriers to discharge        Co-evaluation               AM-PAC PT "6 Clicks" Mobility  Outcome Measure Help needed turning from your back to your side while in a flat bed without using bedrails?: A Little Help needed moving from lying on your back to sitting on the side of a flat bed without using bedrails?: A Lot Help needed moving to and from a bed to a chair (including a wheelchair)?: A Lot Help needed standing up from a chair using your arms (e.g., wheelchair or bedside chair)?: A Lot Help needed to walk in hospital room?: A Lot Help needed climbing 3-5 steps with a railing? : Total 6 Click Score: 12    End of Session Equipment Utilized During Treatment: Gait belt;Oxygen Activity Tolerance: Treatment limited secondary to medical complications (Comment)(dizziness and drop in Sp02) Patient left: in bed;with call bell/phone within reach;with bed alarm  set;with nursing/sitter in room Nurse Communication: Mobility status PT Visit Diagnosis: Pain;Muscle weakness (generalized) (M62.81) Pain - Right/Left: Right Pain - part of body: Arm    Time: 1102-1120 PT Time Calculation (min) (ACUTE ONLY): 18 min   Charges:   PT Evaluation $PT Eval Moderate Complexity: 1 Mod          Mylo Red, PT, DPT Acute Rehabilitation Services Pager [phone] Office [phone]      Blake Divine A Lanier Ensign 10/10/2018, 1:01 PM

## 2018-10-11 LAB — CBC
HCT: 37.9 % — ABNORMAL LOW (ref 39.0–52.0)
Hemoglobin: 12.5 g/dL — ABNORMAL LOW (ref 13.0–17.0)
MCH: 30.7 pg (ref 26.0–34.0)
MCHC: 33 g/dL (ref 30.0–36.0)
MCV: 93.1 fL (ref 80.0–100.0)
PLATELETS: 221 10*3/uL (ref 150–400)
RBC: 4.07 MIL/uL — ABNORMAL LOW (ref 4.22–5.81)
RDW: 13.5 % (ref 11.5–15.5)
WBC: 18.9 10*3/uL — ABNORMAL HIGH (ref 4.0–10.5)
nRBC: 0 % (ref 0.0–0.2)

## 2018-10-11 LAB — BASIC METABOLIC PANEL
Anion gap: 7 (ref 5–15)
BUN: 46 mg/dL — ABNORMAL HIGH (ref 8–23)
CO2: 28 mmol/L (ref 22–32)
Calcium: 8.8 mg/dL — ABNORMAL LOW (ref 8.9–10.3)
Chloride: 97 mmol/L — ABNORMAL LOW (ref 98–111)
Creatinine, Ser: 0.85 mg/dL (ref 0.61–1.24)
GFR calc Af Amer: >60 mL/min (ref >60)
GFR calc non Af Amer: >60 mL/min (ref >60)
Glucose, Bld: 172 mg/dL — ABNORMAL HIGH (ref 70–99)
Potassium: 3.8 mmol/L (ref 3.5–5.1)
SODIUM: 132 mmol/L — AB (ref 135–145)

## 2018-10-11 LAB — PROTIME-INR
INR: 1.5 — AB (ref 0.8–1.2)
PROTHROMBIN TIME: 18.1 s — AB (ref 11.4–15.2)

## 2018-10-11 MED ORDER — AZITHROMYCIN 250 MG PO TABS: 250.0000 mg | ORAL_TABLET | Freq: Every day | ORAL | 0 refills | Status: AC

## 2018-10-11 MED ORDER — PREDNISONE 50 MG PO TABS: 50.0000 mg | ORAL_TABLET | Freq: Every day | ORAL | 0 refills | Status: AC

## 2018-10-11 MED ORDER — HYDROCODONE-ACETAMINOPHEN 5-325 MG PO TABS: 1.0000 | ORAL_TABLET | Freq: Four times a day (QID) | ORAL | 0 refills | Status: AC | PRN

## 2018-10-11 MED ORDER — PREDNISOLONE 5 MG PO TABS: 5.0000 mg | ORAL_TABLET | Freq: Two times a day (BID) | ORAL | 0 refills | Status: AC

## 2018-10-11 MED ORDER — METOPROLOL TARTRATE 25 MG PO TABS: 25.0000 mg | ORAL_TABLET | Freq: Two times a day (BID) | ORAL | 0 refills | Status: AC

## 2018-10-11 MED ORDER — ALPRAZOLAM 0.25 MG PO TABS: 0.2500 mg | ORAL_TABLET | Freq: Every day | ORAL | 0 refills | Status: AC

## 2018-10-11 MED ORDER — DILTIAZEM HCL ER 60 MG PO CP12
120.0000 mg | ORAL_CAPSULE | Freq: Two times a day (BID) | ORAL | Status: DC
  Administered 2018-10-11: 120 mg via ORAL
  Filled 2018-10-11: qty 2

## 2018-10-11 MED ORDER — DILTIAZEM HCL ER 120 MG PO CP12: 120.0000 mg | ORAL_CAPSULE | Freq: Two times a day (BID) | ORAL | 0 refills | Status: AC

## 2018-10-11 MED ORDER — WARFARIN SODIUM 1 MG PO TABS: 1.0000 mg | ORAL_TABLET | Freq: Once | ORAL | Status: DC

## 2018-10-11 MED ORDER — LEVOTHYROXINE SODIUM 75 MCG PO TABS: 75.0000 ug | ORAL_TABLET | Freq: Every day | ORAL | 0 refills | Status: AC

## 2018-10-11 NOTE — TOC Transition Note (Addendum)
Transition of Care [facility]) - CM/SW Discharge Note   Patient Details  Name: Vincent Mcneil MRN: [id number] Date of Birth: 17-Jun-1933  Transition of Care [facility]) CM/SW Contact:  Abigail Butts, LCSW Phone Number: [phone] 10/11/2018, 10:55 AM   Clinical Narrative: CSW confirmed with [facility] admissions that patient can return to their facility today. Alerted patient's son, Thayer Ohm, by phone. Patient will be transferred to [facility] by [facility].   Nurse to call report to [phone]. Patient will go to room 807A at the facility.      Final next level of care: Skilled Nursing Facility Barriers to Discharge: No Barriers Identified   Patient Goals and CMS Choice Patient states their goals for this hospitalization and ongoing recovery are:: return to [facility]  CMS Medicare.gov Compare Post Acute Care list provided to:: Patient Choice offered to / list presented to : Adult Children  Discharge Placement PASRR number recieved: 10/10/18            Patient chooses bed at: [facility], [place] Patient to be transferred to facility by: [facility] Name of family member notified: Jordan Crismon, son Patient and family notified of of transfer: 10/11/18  Discharge Plan and Services   Post Acute Care Choice: Skilled Nursing Facility                    Social Determinants of Health (SDOH) Interventions     Readmission Risk Interventions No flowsheet data found.

## 2018-10-11 NOTE — Progress Notes (Signed)
PROGRESS NOTE    Vincent Mcneil  NLZ:[id number] DOB: 1932/11/04 DOA: 10/07/2018 PCP: Linus Salmons, DO     Brief Narrative:  Vincent Mcneil is a 85 year old male with a significant history of steroid-dependent COPD, chronic hypoxic respiratory failure (wears 2L prn), paroxysmal atrial fibrillation on chronic anticoagulation with Coumadin, sick sinus syndrome s/p pacemaker placement, orthostatic hypotension, hypothyroidism presented to the emergency department with complaints of chronic back pain.  X-ray of the thoracic spine concerning about T7 fracture.  Patient was found to be in atrial fibrillation with rapid ventricular rate of 150s.  Patient was given 2 doses of IV Cardizem, started the patient on Cardizem drip.  Patient was on amiodarone in the past, felt very low A. fib burden as of October 2019, taken off of amiodarone.  Patient is more complaining of pain in the coccyx area, denies having any pain in the thoracic area.  Patient is a poor historian.  Work-up in the emergency department with a chest x-ray showed hyperexpansion with the small bilateral pleural effusions.  Patient was also found to have elevated BUN, creatinine ratio.  Heart rate was better controlled on IV Cardizem drip, transitioned to p.o. Cardizem.  New events last 24 hours / Subjective: No new issues overnight, has no physical complaints this morning.    Assessment & Plan:   Principal Problem:   Paroxysmal atrial fibrillation with RVR (HCC) Active Problems:   Hypothyroidism   Coronary artery disease   Steroid-dependent COPD (HCC)   Elevated troponin   Orthostatic hypotension   Chronic back pain   Pressure injury of skin   Atrial fibrillation with RVR (HCC)   Protein-calorie malnutrition, severe   Paroxysmal atrial fibrillation with RVR -He had been on amiodarone as of October 2019 with documentation of very low a fib burden at that time; he is no longer on amiodarone -Continue Coumadin -Heart rate better  controlled on short acting Cardizem.  Transition to Cardizem 120 mg every 12 hour  Acute bronchitis -Steroid-dependent COPD; chronic hypoxic respiratory failure -Uses 2 L nasal cannula as needed at home -Per nursing report, overnight 3/14, patient had desaturation of his oxygen with productive green sputum.  He does not have any overt wheezes on examination today, currently on room air  -Continue prednisone, azithromycin x 5 days   HLD -Continue pravachol   Chronic diastolic CHF -Without acute exacerbation, appears to be euvolemic at this time  CAD; elevated troponin -Found to have slightly elevated troponin in setting of AF with RVR(demand ischemia)  Chronic low pain -Supportive care  Hypothyroidism -Continue Synthroid -TSH normal   Anxiety -Stable, continue Lexapro and Xanax  Orthostatic hypotension -Continue florinef  Protein-calorie malnutrition  -Continue nutritional supplements  Pressure ulcer, POA -Stage 1 sacrum    DVT prophylaxis: Coumadin Code Status: Full code Family Communication: No family at bedside Disposition Plan: Awaiting SNF placement work up. Discussed with SW.    Consultants:   None  Procedures:   None  Antimicrobials:  Anti-infectives (From admission, onward)   Start     Dose/Rate Route Frequency Ordered Stop   10/09/18 1400  azithromycin (ZITHROMAX) tablet 500 mg     500 mg Oral Daily 10/09/18 1257 10/14/18 0959   10/08/18 1030  azithromycin (ZITHROMAX) 250 mg in dextrose 5 % 125 mL IVPB  Status:  Discontinued     250 mg 125 mL/hr over 60 Minutes Intravenous Every 24 hours 10/08/18 0928 10/08/18 1458   10/08/18 1000  cefTRIAXone (ROCEPHIN) 1 g in sodium chloride 0.9 %  100 mL IVPB  Status:  Discontinued     1 g 200 mL/hr over 30 Minutes Intravenous Every 24 hours 10/08/18 0945 10/08/18 1458   10/08/18 0930  cefTRIAXone (ROCEPHIN) 1 g in sodium chloride 0.9 % 100 mL IVPB  Status:  Discontinued     1 g 200 mL/hr  over 30 Minutes Intravenous Every 24 hours 10/08/18 0928 10/08/18 0944       Objective: Vitals:   10/10/18 2041 10/10/18 2047 10/11/18 0500 10/11/18 0934  BP: 98/73  (!) 128/93 (!) 124/94  Pulse: 87  90 (!) 58  Resp: Temp: (!) 97.4 F (36.3 C)  97.6 F (36.4 C)   TempSrc: Axillary  Axillary   SpO2: 97% 98% 99% 99%  Weight:   52.4 kg   Height:        Intake/Output Summary (Last 24 hours) at 10/11/2018 0943 Last data filed at 10/11/2018 0600 Gross per 24 hour  Intake 771.92 ml  Output 500 ml  Net 271.92 ml   Filed Weights   10/10/18 1419 10/10/18 1434 10/11/18 0500  Weight: 51.9 kg 51.9 kg 52.4 kg    Examination: General exam: Appears calm and comfortable  Respiratory system: Clear to auscultation. Respiratory effort normal. Cardiovascular system: S1 & S2 heard, Irreg rhythm rate 80s  Gastrointestinal system: Abdomen is nondistended, soft and nontender. No organomegaly or masses felt. Normal bowel sounds heard. Central nervous system: Alert and oriented. No focal neurological deficits. Extremities: Symmetric 5 x 5 power. Psychiatry: Judgement and insight appear stable    Data Reviewed: I have personally reviewed following labs and imaging studies  CBC: Recent Labs  Lab 10/07/18 1622 10/08/18 0040 10/09/18 0417 10/10/18 0353 10/11/18 0348  WBC 11.5* 10.8* 8.1 15.5* 18.9*  NEUTROABS 10.1*  --   --   --   --   HGB 12.0* 12.9* 12.3* 11.9* 12.5*  HCT 36.5* 38.3* 37.3* 35.2* 37.9*  MCV 98.1 93.6 93.7 93.9 93.1  PLT 183 196 205 214 221   Basic Metabolic Panel: Recent Labs  Lab 10/07/18 1622 10/07/18 1957 10/08/18 0040 10/09/18 0417 10/10/18 0353 10/11/18 0348  NA 135  --  136 134* 131* 132*  K 4.1  --  3.5 4.4 4.0 3.8  CL 101  --  98 95* 96* 97*  CO2 21*  --  GLUCOSE 110*  --  104* 171* 171* 172*  BUN 31*  --  25* 29* 44* 46*  CREATININE 0.77  --  0.73 0.75 1.10 0.85  CALCIUM 8.6*  --  8.5* 8.6* 9.0 8.8*  MG  --  1.9  --   --    --   --    GFR: Estimated Creatinine Clearance: 48.8 mL/min (by C-G formula based on SCr of 0.85 mg/dL). Liver Function Tests: Recent Labs  Lab 10/07/18 1622  AST 20  ALT 14  ALKPHOS 91  BILITOT 1.3*  PROT 5.2*  ALBUMIN 2.8*   No results for input(s): LIPASE, AMYLASE in the last 168 hours. No results for input(s): AMMONIA in the last 168 hours. Coagulation Profile: Recent Labs  Lab 10/07/18 1622 10/08/18 0040 10/09/18 0417 10/10/18 0353 10/11/18 0348  INR 3.0* 3.1* 4.0* 3.2* 1.5*   Cardiac Enzymes: Recent Labs  Lab 10/07/18 1622 10/08/18 0040  TROPONINI 0.03* 0.03*   BNP (last 3 results) No results for input(s): PROBNP in the last 8760 hours. HbA1C: No results for input(s): HGBA1C in the last 72 hours. CBG:  No results for input(s): GLUCAP in the last 168 hours. Lipid Profile: No results for input(s): CHOL, HDL, LDLCALC, TRIG, CHOLHDL, LDLDIRECT in the last 72 hours. Thyroid Function Tests: No results for input(s): TSH, T4TOTAL, FREET4, T3FREE, THYROIDAB in the last 72 hours. Anemia Panel: No results for input(s): VITAMINB12, FOLATE, FERRITIN, TIBC, IRON, RETICCTPCT in the last 72 hours. Sepsis Labs: No results for input(s): PROCALCITON, LATICACIDVEN in the last 168 hours.  No results found for this or any previous visit (from the past 240 hour(s)).     Radiology Studies: No results found.    Scheduled Meds: . ALPRAZolam  0.25 mg Oral QHS  . azithromycin  500 mg Oral Daily  . budesonide  0.5 mg Nebulization BID  . diltiazem  120 mg Oral Q12H  . escitalopram  10 mg Oral Daily  . feeding supplement  1 Container Oral BID BM  . feeding supplement (ENSURE ENLIVE)  237 mL Oral BID BM  . fludrocortisone  0.1 mg Oral Daily  . ipratropium-albuterol  3 mL Nebulization BID  . levothyroxine  75 mcg Oral QAC breakfast  . lidocaine  1 patch Transdermal Q24H  . metoprolol tartrate  25 mg Oral BID  . oxybutynin  5 mg Oral QHS  . pravastatin  40 mg Oral q1800  .  predniSONE  50 mg Oral Q breakfast  . warfarin  1 mg Oral ONCE-1800  . Warfarin - Pharmacist Dosing Inpatient   Does not apply q1800   Continuous Infusions:    LOS: 3 days    Time spent: 20 minutes   Noralee Stain, DO [facility] [email or web address] 10/11/2018, 9:43 AM

## 2018-10-11 NOTE — Discharge Summary (Signed)
Physician Discharge Summary  Vincent Mcneil ZOX:[id number] DOB: Aug 09, 1932 DOA: 10/07/2018  PCP: Linus Salmons, DO  Admit date: 10/07/2018 Discharge date: 10/11/2018  Admitted From: SNF Disposition:  SNF   Recommendations for Outpatient Follow-up:  1. Follow up with PCP in 1 week 2. Follow up with Cardiology Dr. Nolon Rod, in 1 week, at [facility]  Discharge Condition: Stable CODE STATUS: Full  Diet recommendation: Heart healthy   Brief/Interim Summary: Vincent Mcneil is a 85 year old male with a significant history of steroid-dependent COPD, chronic hypoxic respiratory failure (wears 2L prn), paroxysmal atrial fibrillation on chronic anticoagulation with Coumadin, sick sinus syndrome s/p pacemaker placement, orthostatic hypotension, hypothyroidism presented to the emergency department with complaints of chronic back pain. X-ray of the thoracic spine concerning about T7 fracture. Patient was found to be in atrial fibrillation with rapid ventricular rate of 150s. Patient was given 2 doses of IV Cardizem, started the patient on Cardizem drip. Patient was on amiodarone in the past, felt very low A. fib burden as of October 2019, taken off of amiodarone. Patient is more complaining of pain in the coccyx area, denies having any pain in the thoracic area. Patient is a poor historian. Work-up in the emergency department with a chest x-ray showed hyperexpansion with the small bilateral pleural effusions. Patient was also found to have elevated BUN, creatinine ratio.  Heart rate was better controlled on IV Cardizem drip, transitioned to p.o. Cardizem.   Discharge Diagnoses:  Principal Problem:   Paroxysmal atrial fibrillation with RVR (HCC) Active Problems:   Hypothyroidism   Coronary artery disease   Steroid-dependent COPD (HCC)   Elevated troponin   Orthostatic hypotension   Chronic back pain   Pressure injury of skin   Atrial fibrillation with RVR (HCC)    Protein-calorie malnutrition, severe   Paroxysmal atrial fibrillation with RVR -He had been on amiodarone as of October 2019 with documentation of very low a fib burden at that time; he is no longer on amiodarone -Continue Coumadin -Heart rate better controlled on short acting Cardizem.  Transition to Cardizem 120 mg every 12 hour  Acute bronchitis -Steroid-dependent COPD; chronic hypoxic respiratory failure -Uses 2 L nasal cannula as needed at home -Per nursing report, overnight 3/14, patient had desaturation of his oxygen with productive green sputum.  He does not have any overt wheezes on examination today, currently on room air  -Continue prednisone, azithromycin x 5 days (last day 3/19)   HLD -Continue pravachol   Chronic diastolic CHF -Without acute exacerbation, appears to be euvolemic at this time  CAD; elevated troponin -Found to have slightly elevated troponin in setting of AF with RVR(demand ischemia)  Chronic low pain -Supportive care  Hypothyroidism -Continue Synthroid -TSH normal   Anxiety -Stable, continue Lexapro and Xanax  Orthostatic hypotension -Continue florinef  Protein-calorie malnutrition  -Continue nutritional supplements  Pressure ulcer, POA -Stage 1 sacrum    Discharge Instructions  Discharge Instructions    Diet - low sodium heart healthy   Complete by:  As directed    Increase activity slowly   Complete by:  As directed      Allergies as of 10/11/2018   No Known Allergies     Medication List    STOP taking these medications   potassium chloride 20 MEQ packet Commonly known as:  KLOR-CON     TAKE these medications   albuterol 108 (90 Base) MCG/ACT inhaler Commonly known as:  PROVENTIL HFA;VENTOLIN HFA Inhale 2 puffs into the lungs  every 6 (six) hours as needed for wheezing or shortness of breath.   ALPRAZolam 0.25 MG tablet Commonly known as:  XANAX Take 1 tablet (0.25 mg total) by mouth at  bedtime. What changed:  Another medication with the same name was removed. Continue taking this medication, and follow the directions you see here.   azithromycin 250 MG tablet Commonly known as:  ZITHROMAX Take 1 tablet (250 mg total) by mouth daily for 2 days.   budesonide 0.5 MG/2ML nebulizer solution Commonly known as:  PULMICORT Take 0.5 mg by nebulization daily.   cholecalciferol 10 MCG (400 UNIT) Tabs tablet Commonly known as:  VITAMIN D3 Take 400 Units by mouth every other day.   Vitamin D3 1.25 MG (50000 UT) Caps Take 50,000 Units by mouth once a week. Thursday   diltiazem 120 MG 12 hr capsule Commonly known as:  CARDIZEM SR Take 1 capsule (120 mg total) by mouth every 12 (twelve) hours.   escitalopram 10 MG tablet Commonly known as:  LEXAPRO Take 10 mg by mouth daily.   feeding supplement Liqd Take 1 Container by mouth 2 (two) times daily.   ferrous sulfate 325 (65 FE) MG tablet Take 325 mg by mouth daily with breakfast.   fludrocortisone 0.1 MG tablet Commonly known as:  FLORINEF Take 0.1 mg by mouth daily.   HYDROcodone-acetaminophen 5-325 MG tablet Commonly known as:  NORCO/VICODIN Take 1 tablet by mouth every 6 (six) hours as needed for moderate pain.   ipratropium-albuterol 0.5-2.5 (3) MG/3ML Soln Commonly known as:  DUONEB Take 3 mLs by nebulization 2 (two) times daily.   ipratropium-albuterol 0.5-2.5 (3) MG/3ML Soln Commonly known as:  DUONEB Take 3 mLs by nebulization every 6 (six) hours as needed.   Krill Oil 1000 MG Caps Take 1,000 mg by mouth daily.   levothyroxine 75 MCG tablet Commonly known as:  SYNTHROID, LEVOTHROID Take 1 tablet (75 mcg total) by mouth daily before breakfast. What changed:    medication strength  how much to take   Lidocaine 4 % Ptch Apply 1 packet topically daily. Apply to Back topically one time a day for pair remove in the H.S   metoprolol tartrate 25 MG tablet Commonly known as:  LOPRESSOR Take 1 tablet  (25 mg total) by mouth 2 (two) times daily.   OVER THE COUNTER MEDICATION Take 1 Container by mouth 2 (two) times daily. Magic Cup   oxybutynin 5 MG tablet Commonly known as:  DITROPAN Take 5 mg by mouth at bedtime.   OXYGEN Inhale 2 L into the lungs continuous.   pravastatin 40 MG tablet Commonly known as:  PRAVACHOL Take 40 mg by mouth at bedtime.   prednisoLONE 5 MG Tabs tablet Take 1 tablet (5 mg total) by mouth 2 (two) times daily. Start taking on:  October 14, 2018 What changed:  These instructions start on October 14, 2018. If you are unsure what to do until then, ask your doctor or other care provider.   predniSONE 50 MG tablet Commonly known as:  DELTASONE Take 1 tablet (50 mg total) by mouth daily with breakfast for 1 day. Start taking on:  October 12, 2018   Spiriva HandiHaler 18 MCG inhalation capsule Generic drug:  tiotropium Place 18 mcg into inhaler and inhale daily.   vitamin C 500 MG tablet Commonly known as:  ASCORBIC ACID Take 500 mg by mouth daily.   warfarin 1 MG tablet Commonly known as:  COUMADIN Take 1 mg by mouth at bedtime.  zinc sulfate 220 (50 Zn) MG capsule Take 220 mg by mouth daily.       Contact information for follow-up providers    Linus Salmons, DO. Schedule an appointment as soon as possible for a visit in 1 week(s).   Specialty:  Family Medicine Contact information: [address] [place] [address] [phone]        Nolon Rod. Schedule an appointment as soon as possible for a visit in 1 week(s).   Why:  Follow up with Cardiology Dr. Nolon Rod, or establish with a new Cardiologist  Contact information: at [facility]           Contact information for after-discharge care    Destination    [facility] Preferred SNF .   Service:  Skilled Nursing Contact information: [address] [place] [address] [phone]                  No Known Allergies  Consultations:  None    Procedures/Studies: Dg Chest 2 View  Result Date: 10/07/2018 CLINICAL DATA:  Shortness of breath EXAM: CHEST - 2 VIEW COMPARISON:  None. FINDINGS: Lungs are hyperexpanded. There are small pleural effusions bilaterally. There is no frank edema or consolidation. Heart is upper normal in size with pulmonary vascularity normal. No adenopathy. Pacemaker leads are present in the regions of the right ventricle and small cardiac vein. Bones are osteoporotic. There is anterior wedging of a midthoracic vertebral body. IMPRESSION: Lungs hyperexpanded with small pleural effusions bilaterally. No edema or consolidation. Heart size within normal limits. Bones osteoporotic. Electronically Signed   By: Bretta Bang III M.D.   On: 10/07/2018 17:51   Dg Sacrum/coccyx  Result Date: 10/08/2018 CLINICAL DATA:  Chronic back pain EXAM: SACRUM AND COCCYX - 2+ VIEW COMPARISON:  None. FINDINGS: Mild degenerative changes in the hips bilaterally with joint space narrowing and spurring. No acute bony abnormality. Specifically, no fracture, subluxation, or dislocation. No visible sacral or coccygeal fracture. IMPRESSION: No acute bony abnormality. Electronically Signed   By: Charlett Nose M.D.   On: 10/08/2018 16:57      Discharge Exam: Vitals:   10/11/18 0500 10/11/18 0934  BP: (!) 128/93 (!) 124/94  Pulse: 90 (!) 58  Resp: 12 16  Temp: 97.6 F (36.4 C)   SpO2: 99% 99%    General: Pt is alert, awake, not in acute distress Cardiovascular: Irregular rhythm, rate 80s, S1/S2 +, no rubs, no gallops Respiratory: CTA bilaterally, no wheezing, no rhonchi Abdominal: Soft, NT, ND, bowel sounds + Extremities: no edema, no cyanosis    The results of significant diagnostics from this hospitalization (including imaging, microbiology, ancillary and laboratory) are listed below for reference.     Microbiology: No results found for this or any previous visit (from  the past 240 hour(s)).   Labs: BNP (last 3 results) Recent Labs    10/07/18 1622  BNP 356.0*   Basic Metabolic Panel: Recent Labs  Lab 10/07/18 1622 10/07/18 1957 10/08/18 0040 10/09/18 0417 10/10/18 0353 10/11/18 0348  NA 135  --  136 134* 131* 132*  K 4.1  --  3.5 4.4 4.0 3.8  CL 101  --  98 95* 96* 97*  CO2 21*  --  25 26 26 28   GLUCOSE 110*  --  104* 171* 171* 172*  BUN 31*  --  25* 29* 44* 46*  CREATININE 0.77  --  0.73 0.75 1.10 0.85  CALCIUM 8.6*  --  8.5* 8.6* 9.0 8.8*  MG  --  1.9  --   --   --   --    Liver Function Tests: Recent Labs  Lab 10/07/18 1622  AST 20  ALT 14  ALKPHOS 91  BILITOT 1.3*  PROT 5.2*  ALBUMIN 2.8*   No results for input(s): LIPASE, AMYLASE in the last 168 hours. No results for input(s): AMMONIA in the last 168 hours. CBC: Recent Labs  Lab 10/07/18 1622 10/08/18 0040 10/09/18 0417 10/10/18 0353 10/11/18 0348  WBC 11.5* 10.8* 8.1 15.5* 18.9*  NEUTROABS 10.1*  --   --   --   --   HGB 12.0* 12.9* 12.3* 11.9* 12.5*  HCT 36.5* 38.3* 37.3* 35.2* 37.9*  MCV 98.1 93.6 93.7 93.9 93.1  PLT 183 196 205 214 221   Cardiac Enzymes: Recent Labs  Lab 10/07/18 1622 10/08/18 0040  TROPONINI 0.03* 0.03*   BNP: Invalid input(s): POCBNP CBG: No results for input(s): GLUCAP in the last 168 hours. D-Dimer No results for input(s): DDIMER in the last 72 hours. Hgb A1c No results for input(s): HGBA1C in the last 72 hours. Lipid Profile No results for input(s): CHOL, HDL, LDLCALC, TRIG, CHOLHDL, LDLDIRECT in the last 72 hours. Thyroid function studies No results for input(s): TSH, T4TOTAL, T3FREE, THYROIDAB in the last 72 hours.  Invalid input(s): FREET3 Anemia work up No results for input(s): VITAMINB12, FOLATE, FERRITIN, TIBC, IRON, RETICCTPCT in the last 72 hours. Urinalysis No results found for: COLORURINE, APPEARANCEUR, LABSPEC, PHURINE, GLUCOSEU, HGBUR, BILIRUBINUR, KETONESUR, PROTEINUR, UROBILINOGEN, NITRITE,  LEUKOCYTESUR Sepsis Labs Invalid input(s): PROCALCITONIN,  WBC,  LACTICIDVEN Microbiology No results found for this or any previous visit (from the past 240 hour(s)).   Patient was seen and examined on the day of discharge and was found to be in stable condition. Time coordinating discharge: 35 minutes including assessment and coordination of care, as well as examination of the patient.   SIGNED:  Noralee Stain, DO [facility] [email or web address] 10/11/2018, 10:43 AM

## 2018-10-11 NOTE — Progress Notes (Signed)
ANTICOAGULATION CONSULT NOTE  Pharmacy Consult for warfarin Indication: atrial fibrillation  No Known Allergies  Patient Measurements: Height: 5\' 9"  (175.3 cm) Weight: 115 lb 8.3 oz (52.4 kg) IBW/kg (Calculated) : 70.7  Vital Signs: Temp: 97.6 F (36.4 C) (03/17 0500) Temp Source: Axillary (03/17 0500) BP: 128/93 (03/17 0500) Pulse Rate: 90 (03/17 0500)  Labs: Recent Labs    10/09/18 0417 10/10/18 0353 10/11/18 0348  HGB 12.3* 11.9* 12.5*  HCT 37.3* 35.2* 37.9*  PLT 205 214 221  LABPROT 38.1* 32.2* 18.1*  INR 4.0* 3.2* 1.5*  CREATININE 0.75 1.10 0.85    Estimated Creatinine Clearance: 48.8 mL/min (by C-G formula based on SCr of 0.85 mg/dL).   Medical History: Past Medical History:  Diagnosis Date  . Adrenal insufficiency (HCC)   . Anemia   . BPH without obstruction/lower urinary tract symptoms   . Chronic anticoagulation   . COPD (chronic obstructive pulmonary disease) (HCC)   . Coronary artery disease   . Hypothyroidism   . Osteoarthritis   . Overactive bladder   . Paroxysmal A-fib (HCC)   . Vitamin D deficiency    Assessment: 45 yoM admitted 3/13 with back pain. Patient is on warfarin PTA for atrial fibrillation. INR 3.0 - on the upper end of therapeutic. Of note patient has had warfarin held for 2 nights (last dose on 3/10 PM per nursing home [facility]) and last INR 3.2 on 3/12 reported from SNF.   PTA dose: warfarin 1 mg PO daily  INR today trended down from 3.2 to 1.5 today, received warfarin 0.5 mg last night. Hgb 12.5, plt 221. No s/sx of bleeding. On concurrent azithromycin. Oral intake now improving to 75-100% - with Boost and Ensure supplements (do contain small amounts of vitamin K).   Goal of Therapy:  INR 2-3 Monitor platelets by anticoagulation protocol: Yes   Plan:  Warfarin 1 mg PO x1 tonight Daily INR, s/s bleeding  Sherron Monday, PharmD, BCCCP Clinical Pharmacist  Pager: [phone] Phone: [phone] Please check AMION for all [facility]  numbers 10/11/2018 9:22 AM

## 2018-10-25 ENCOUNTER — Ambulatory Visit
Admission: RE | Admit: 2018-10-25 | Discharge: 2018-10-25 | Disposition: A | Discharge status: Home / Self Care | Payer: 59 | Source: Ambulatory Visit | Attending: Internal Medicine | Admitting: Internal Medicine

## 2018-10-25 ENCOUNTER — Other Ambulatory Visit: Payer: Self-pay

## 2018-10-25 DIAGNOSIS — M549 Dorsalgia, unspecified: Secondary | ICD-10-CM

## 2018-10-26 ENCOUNTER — Other Ambulatory Visit: Payer: Self-pay

## 2018-12-26 DEATH — deceased

## 2020-09-01 IMAGING — DX CHEST - 2 VIEW
2 series · 3 of 3 positions shown · non-contrast
Comparison: None.

CLINICAL DATA: Shortness of breath

EXAM:
CHEST - 2 VIEW

[chest lat]
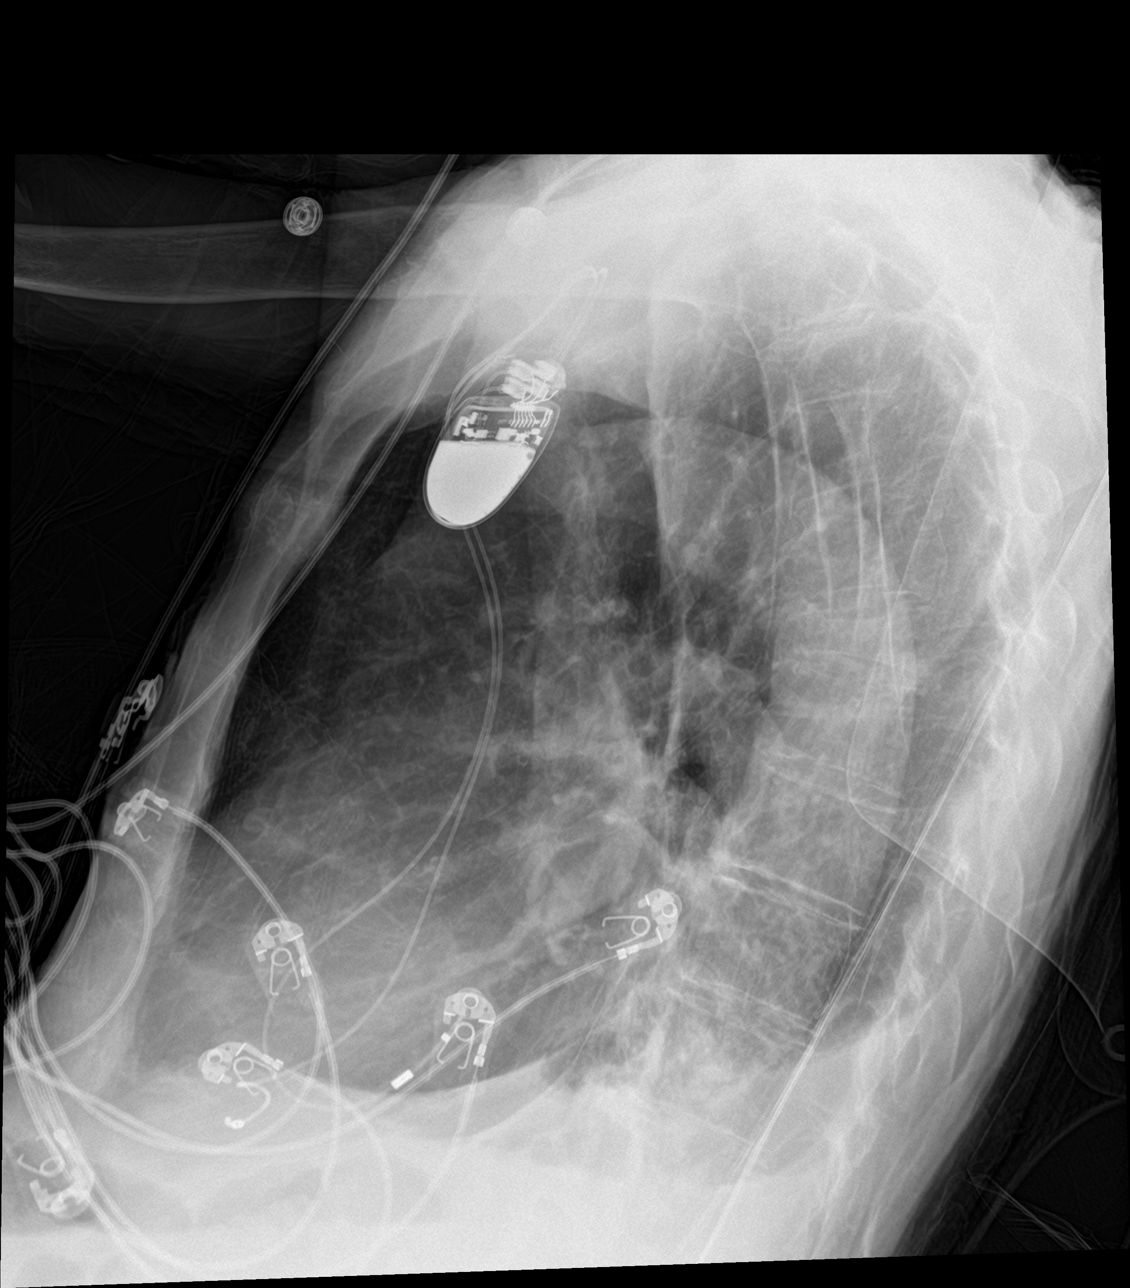

[Series 3: chest ap · 0.14mm/px · 2 of 2 slices shown]
[im 1/2]
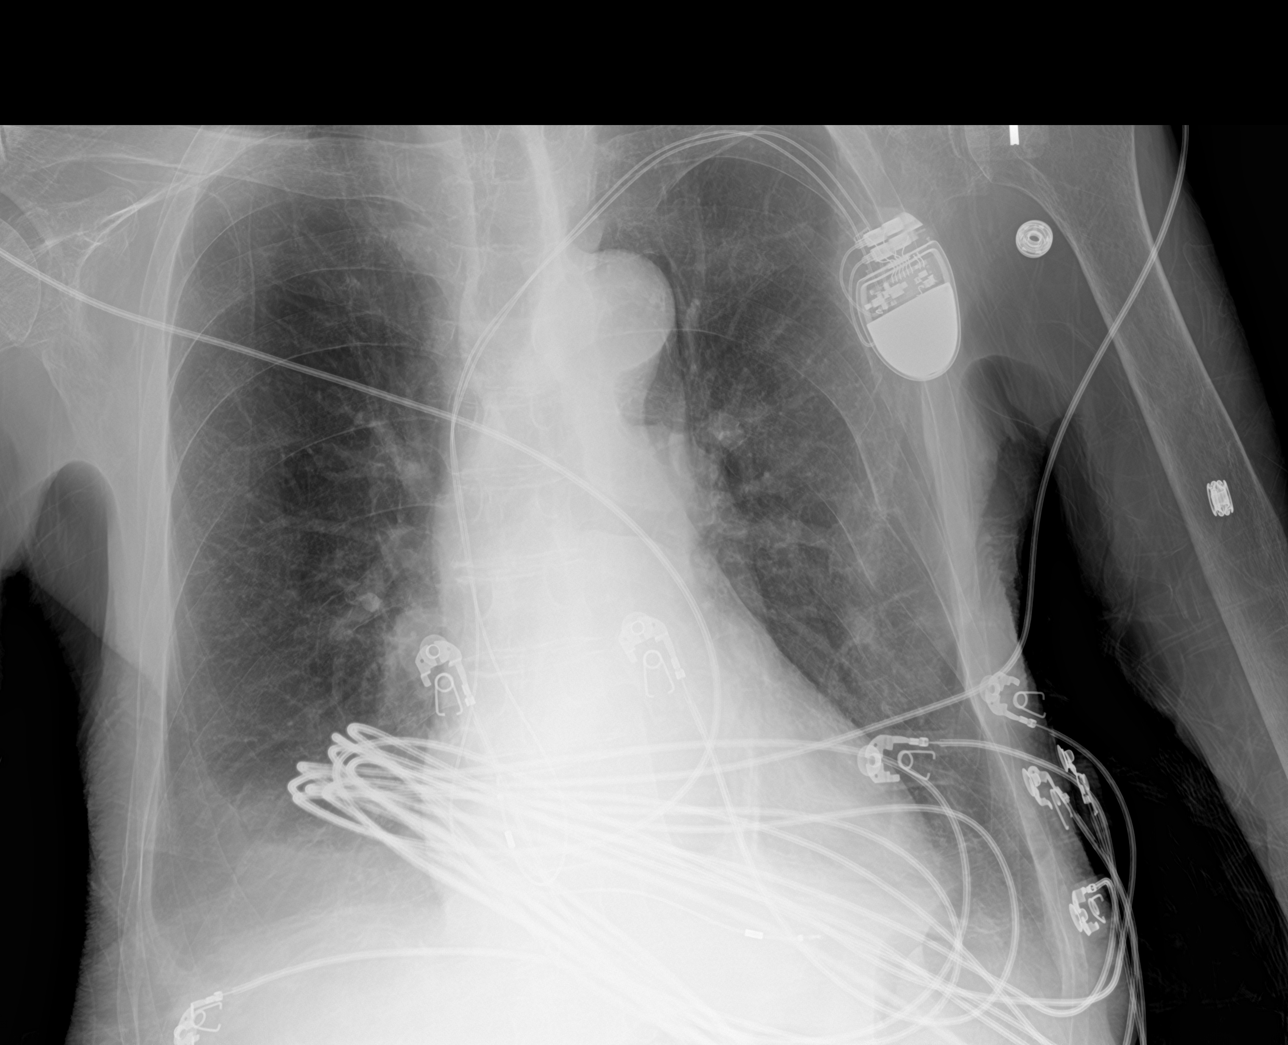
[im 2/2]
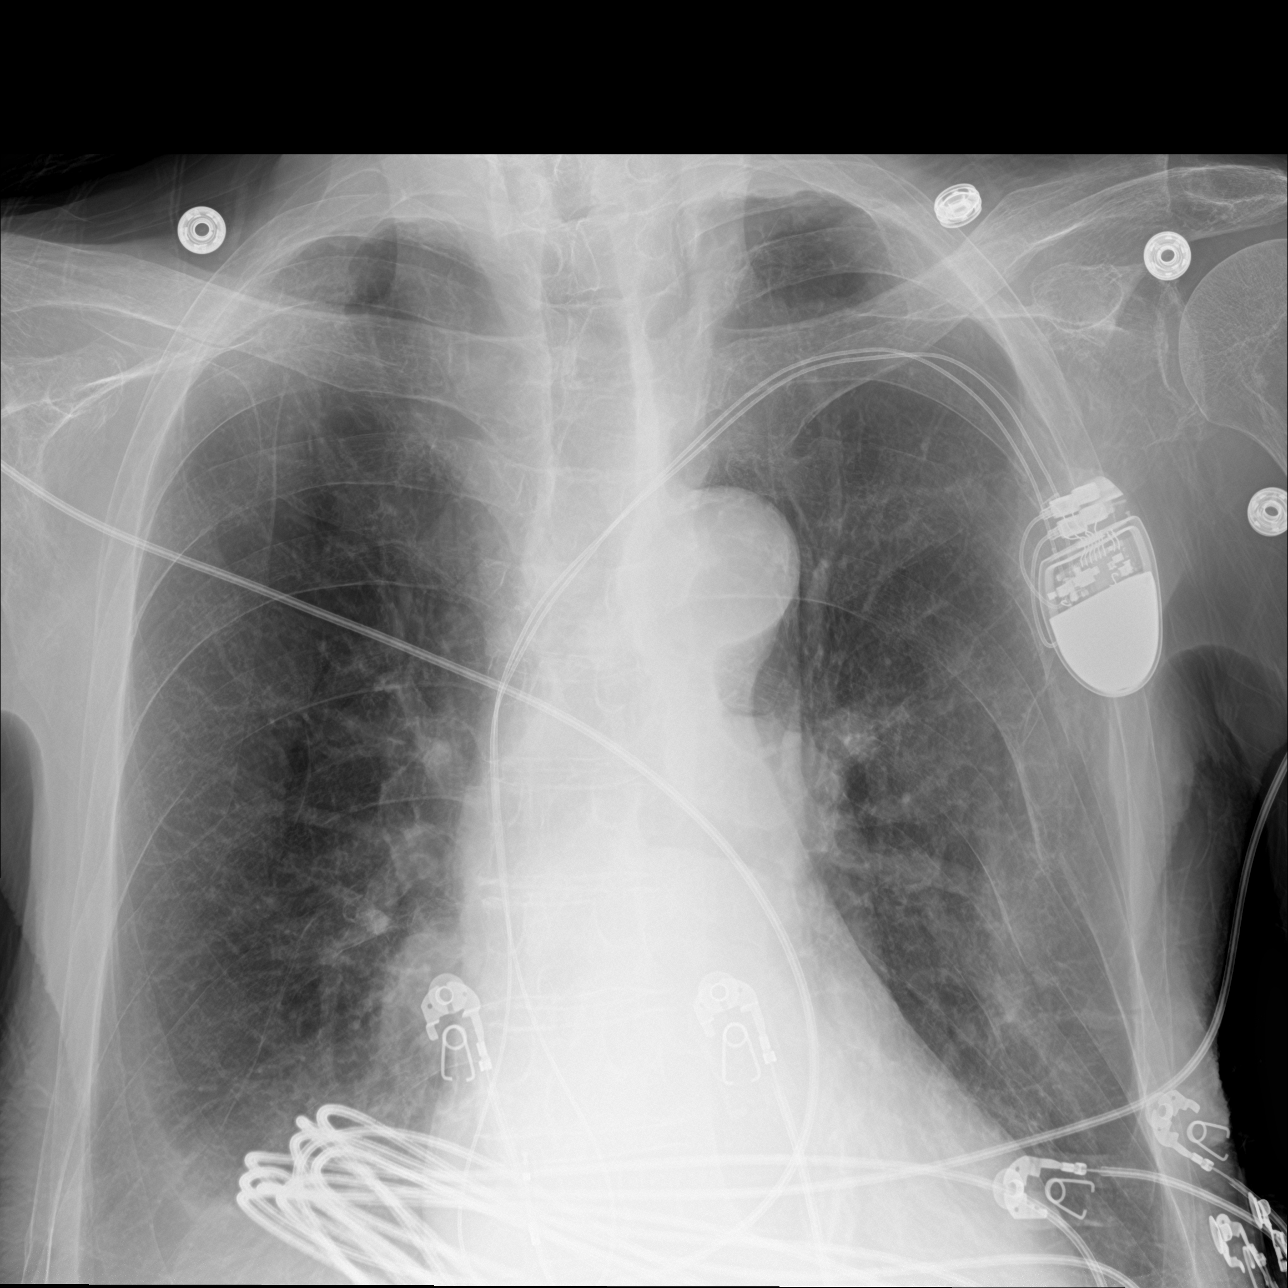

[3 of 3 positions shown; findings below may reference images not displayed]

FINDINGS: Lungs are hyperexpanded. There are small pleural effusions
bilaterally. There is no frank edema or consolidation. Heart is
upper normal in size with pulmonary vascularity normal. No
adenopathy. Pacemaker leads are present in the regions of the right
ventricle and small cardiac vein. Bones are osteoporotic. There is
anterior wedging of a midthoracic vertebral body.
IMPRESSION: Lungs hyperexpanded with small pleural effusions bilaterally. No
edema or consolidation. Heart size within normal limits. Bones
osteoporotic.
